# Patient Record
Sex: Female | Born: 1950 | Race: White | Hispanic: No | Marital: Married | Smoking: Never smoker
Health system: Southern US, Community
[De-identification: ages and names within clinical notes are randomized; demographics above are authoritative.]

## PROBLEM LIST (undated history)

## (undated) ENCOUNTER — Emergency Department (HOSPITAL_COMMUNITY): Admission: EM | Disposition: A | Discharge status: Other Acute Inpatient Hospital | Payer: BC Managed Care – PPO

## (undated) DIAGNOSIS — C801 Malignant (primary) neoplasm, unspecified: Secondary | ICD-10-CM

## (undated) DIAGNOSIS — F419 Anxiety disorder, unspecified: Secondary | ICD-10-CM

## (undated) DIAGNOSIS — Z789 Other specified health status: Secondary | ICD-10-CM

## (undated) DIAGNOSIS — Z86718 Personal history of other venous thrombosis and embolism: Secondary | ICD-10-CM

## (undated) HISTORY — PX: TONSILLECTOMY: SUR1361

## (undated) HISTORY — PX: BRAIN SURGERY: SHX531

## (undated) HISTORY — PX: TUBAL LIGATION: SHX77

---

## 2003-09-12 DIAGNOSIS — Z86718 Personal history of other venous thrombosis and embolism: Secondary | ICD-10-CM

## 2003-09-12 HISTORY — DX: Personal history of other venous thrombosis and embolism: Z86.718

## 2012-04-18 ENCOUNTER — Encounter (INDEPENDENT_AMBULATORY_CARE_PROVIDER_SITE_OTHER): Payer: Self-pay | Admitting: *Deleted

## 2012-04-19 ENCOUNTER — Encounter (INDEPENDENT_AMBULATORY_CARE_PROVIDER_SITE_OTHER): Payer: Self-pay

## 2014-02-09 HISTORY — PX: PORTACATH PLACEMENT: SHX2246

## 2014-02-25 DIAGNOSIS — C259 Malignant neoplasm of pancreas, unspecified: Secondary | ICD-10-CM | POA: Insufficient documentation

## 2014-03-06 ENCOUNTER — Inpatient Hospital Stay (HOSPITAL_COMMUNITY)
Admission: EM | Admit: 2014-03-06 | Discharge: 2014-03-06 | DRG: 281 | Disposition: A | Discharge status: Home / Self Care | Payer: BC Managed Care – PPO | Source: Other Acute Inpatient Hospital | Attending: Cardiology | Admitting: Cardiology

## 2014-03-06 ENCOUNTER — Encounter (HOSPITAL_COMMUNITY)
Admission: EM | Disposition: A | Discharge status: Home / Self Care | Payer: BC Managed Care – PPO | Source: Other Acute Inpatient Hospital | Attending: Cardiology

## 2014-03-06 ENCOUNTER — Encounter (HOSPITAL_COMMUNITY): Payer: Self-pay

## 2014-03-06 DIAGNOSIS — E7800 Pure hypercholesterolemia, unspecified: Secondary | ICD-10-CM | POA: Diagnosis present

## 2014-03-06 DIAGNOSIS — I2129 ST elevation (STEMI) myocardial infarction involving other sites: Principal | ICD-10-CM | POA: Diagnosis present

## 2014-03-06 DIAGNOSIS — C259 Malignant neoplasm of pancreas, unspecified: Secondary | ICD-10-CM | POA: Diagnosis present

## 2014-03-06 DIAGNOSIS — Z8673 Personal history of transient ischemic attack (TIA), and cerebral infarction without residual deficits: Secondary | ICD-10-CM

## 2014-03-06 DIAGNOSIS — I2510 Atherosclerotic heart disease of native coronary artery without angina pectoris: Secondary | ICD-10-CM | POA: Diagnosis present

## 2014-03-06 DIAGNOSIS — M47814 Spondylosis without myelopathy or radiculopathy, thoracic region: Secondary | ICD-10-CM | POA: Diagnosis present

## 2014-03-06 HISTORY — PX: CARDIAC CATHETERIZATION: SHX172

## 2014-03-06 HISTORY — DX: Other specified health status: Z78.9

## 2014-03-06 HISTORY — PX: LEFT HEART CATH: SHX5478

## 2014-03-06 LAB — COMPREHENSIVE METABOLIC PANEL
ALBUMIN: 3 g/dL — AB (ref 3.5–5.2)
ALK PHOS: 217 U/L — AB (ref 39–117)
ALT: 206 U/L — ABNORMAL HIGH (ref 0–35)
AST: 145 U/L — AB (ref 0–37)
BUN: 15 mg/dL (ref 6–23)
CO2: 25 mEq/L (ref 19–32)
Calcium: 9.2 mg/dL (ref 8.4–10.5)
Chloride: 97 mEq/L (ref 96–112)
Creatinine, Ser: 0.56 mg/dL (ref 0.50–1.10)
GFR calc Af Amer: >90 mL/min (ref >90)
GFR calc non Af Amer: >90 mL/min (ref >90)
Glucose, Bld: 106 mg/dL — ABNORMAL HIGH (ref 70–99)
POTASSIUM: 3.9 meq/L (ref 3.7–5.3)
Sodium: 137 mEq/L (ref 137–147)
TOTAL PROTEIN: 6.1 g/dL (ref 6.0–8.3)
Total Bilirubin: 0.7 mg/dL (ref 0.3–1.2)

## 2014-03-06 LAB — CBC
HCT: 37.5 % (ref 36.0–46.0)
Hemoglobin: 12.4 g/dL (ref 12.0–15.0)
MCH: 28.8 pg (ref 26.0–34.0)
MCHC: 33.1 g/dL (ref 30.0–36.0)
MCV: 87.2 fL (ref 78.0–100.0)
PLATELETS: 194 10*3/uL (ref 150–400)
RBC: 4.3 MIL/uL (ref 3.87–5.11)
RDW: 13.7 % (ref 11.5–15.5)
WBC: 4.2 10*3/uL (ref 4.0–10.5)

## 2014-03-06 LAB — TROPONIN I
Troponin I: <0.3 ng/mL (ref <0.30)
Troponin I: <0.3 ng/mL (ref <0.30)

## 2014-03-06 LAB — MAGNESIUM: MAGNESIUM: 2.3 mg/dL (ref 1.5–2.5)

## 2014-03-06 LAB — LIPID PANEL
CHOL/HDL RATIO: 4.2 ratio
CHOLESTEROL: 213 mg/dL — AB (ref 0–200)
HDL: 51 mg/dL (ref >39)
LDL Cholesterol: 146 mg/dL — ABNORMAL HIGH (ref 0–99)
Triglycerides: 81 mg/dL (ref <150)
VLDL: 16 mg/dL (ref 0–40)

## 2014-03-06 LAB — TSH: TSH: 3.93 u[IU]/mL (ref 0.350–4.500)

## 2014-03-06 LAB — PROTIME-INR
INR: 1 (ref 0.00–1.49)
PROTHROMBIN TIME: 13.2 s (ref 11.6–15.2)

## 2014-03-06 LAB — POCT ACTIVATED CLOTTING TIME: Activated Clotting Time: 140 seconds

## 2014-03-06 LAB — MRSA PCR SCREENING: MRSA BY PCR: NEGATIVE

## 2014-03-06 SURGERY — LEFT HEART CATH
Anesthesia: LOCAL

## 2014-03-06 MED ORDER — ISOSORBIDE MONONITRATE ER 30 MG PO TB24: 30.0000 mg | ORAL_TABLET | Freq: Every day | ORAL | Status: DC

## 2014-03-06 MED ORDER — METOPROLOL TARTRATE 12.5 MG HALF TABLET
12.5000 mg | ORAL_TABLET | Freq: Two times a day (BID) | ORAL | Status: DC
  Administered 2014-03-06: 12.5 mg via ORAL
  Filled 2014-03-06 (×2): qty 1

## 2014-03-06 MED ORDER — FENTANYL CITRATE 0.05 MG/ML IJ SOLN
INTRAMUSCULAR | Status: AC
  Filled 2014-03-06: qty 2

## 2014-03-06 MED ORDER — SODIUM CHLORIDE 0.9 % IJ SOLN: 3.0000 mL | INTRAMUSCULAR | Status: DC | PRN

## 2014-03-06 MED ORDER — ATORVASTATIN CALCIUM 80 MG PO TABS
80.0000 mg | ORAL_TABLET | Freq: Every day | ORAL | Status: DC
  Filled 2014-03-06: qty 1

## 2014-03-06 MED ORDER — SODIUM CHLORIDE 0.9 % IV SOLN: 250.0000 mL | INTRAVENOUS | Status: DC | PRN

## 2014-03-06 MED ORDER — ASPIRIN EC 81 MG PO TBEC
81.0000 mg | DELAYED_RELEASE_TABLET | Freq: Every day | ORAL | Status: DC
Start: 2014-03-07 — End: 2014-03-06

## 2014-03-06 MED ORDER — ASPIRIN 81 MG PO CHEW
324.0000 mg | CHEWABLE_TABLET | ORAL | Status: AC
  Administered 2014-03-06: 324 mg via ORAL
  Filled 2014-03-06: qty 4

## 2014-03-06 MED ORDER — ROSUVASTATIN CALCIUM 5 MG PO TABS: 5.0000 mg | ORAL_TABLET | Freq: Every day | ORAL | Status: DC

## 2014-03-06 MED ORDER — SODIUM CHLORIDE 0.9 % IJ SOLN
3.0000 mL | Freq: Two times a day (BID) | INTRAMUSCULAR | Status: DC
  Administered 2014-03-06: 10 mL via INTRAVENOUS

## 2014-03-06 MED ORDER — BIVALIRUDIN 250 MG IV SOLR
INTRAVENOUS | Status: AC
  Filled 2014-03-06: qty 250

## 2014-03-06 MED ORDER — MIDAZOLAM HCL 2 MG/2ML IJ SOLN
INTRAMUSCULAR | Status: AC
  Filled 2014-03-06: qty 2

## 2014-03-06 MED ORDER — SODIUM CHLORIDE 0.9 % IJ SOLN
3.0000 mL | Freq: Two times a day (BID) | INTRAMUSCULAR | Status: DC
Start: 2014-03-06 — End: 2014-03-06
  Administered 2014-03-06: 09:00:00 via INTRAVENOUS

## 2014-03-06 MED ORDER — HEPARIN (PORCINE) IN NACL 2-0.9 UNIT/ML-% IJ SOLN
INTRAMUSCULAR | Status: AC
  Filled 2014-03-06: qty 1500

## 2014-03-06 MED ORDER — NITROGLYCERIN 0.2 MG/ML ON CALL CATH LAB
INTRAVENOUS | Status: AC
  Filled 2014-03-06: qty 1

## 2014-03-06 MED ORDER — ASPIRIN 300 MG RE SUPP
300.0000 mg | RECTAL | Status: AC
  Filled 2014-03-06: qty 1

## 2014-03-06 MED ORDER — SODIUM CHLORIDE 0.9 % IJ SOLN
3.0000 mL | INTRAMUSCULAR | Status: DC | PRN
  Administered 2014-03-06: 09:00:00 via INTRAVENOUS

## 2014-03-06 MED ORDER — NITROGLYCERIN 0.4 MG SL SUBL: 0.4000 mg | SUBLINGUAL_TABLET | SUBLINGUAL | Status: DC | PRN

## 2014-03-06 MED ORDER — LIDOCAINE HCL (PF) 1 % IJ SOLN
INTRAMUSCULAR | Status: AC
  Filled 2014-03-06: qty 30

## 2014-03-06 MED ORDER — NITROGLYCERIN IN D5W 200-5 MCG/ML-% IV SOLN
5.0000 ug/min | INTRAVENOUS | Status: DC
  Administered 2014-03-06: 5 ug/min via INTRAVENOUS
  Filled 2014-03-06: qty 250

## 2014-03-06 MED ORDER — ASPIRIN 81 MG PO TBEC: 81.0000 mg | DELAYED_RELEASE_TABLET | Freq: Every day | ORAL | Status: AC

## 2014-03-06 MED ORDER — ONDANSETRON HCL 4 MG/2ML IJ SOLN: 4.0000 mg | Freq: Four times a day (QID) | INTRAMUSCULAR | Status: DC | PRN

## 2014-03-06 MED ORDER — SODIUM CHLORIDE 0.9 % IV SOLN
250.0000 mL | INTRAVENOUS | Status: AC | PRN
  Administered 2014-03-06: 09:00:00 via INTRAVENOUS

## 2014-03-06 MED ORDER — ACETAMINOPHEN 325 MG PO TABS: 650.0000 mg | ORAL_TABLET | ORAL | Status: DC | PRN

## 2014-03-06 MED ORDER — ACETAMINOPHEN 325 MG PO TABS
650.0000 mg | ORAL_TABLET | ORAL | Status: DC | PRN
  Administered 2014-03-06: 650 mg via ORAL
  Filled 2014-03-06: qty 2

## 2014-03-06 MED ORDER — PANTOPRAZOLE SODIUM 40 MG PO TBEC
40.0000 mg | DELAYED_RELEASE_TABLET | Freq: Every day | ORAL | Status: DC
  Administered 2014-03-06: 40 mg via ORAL
  Filled 2014-03-06: qty 1

## 2014-03-06 NOTE — CV Procedure (Signed)
Cardiac cath report dictated on 03/06/2014 dictation number is [id number]

## 2014-03-06 NOTE — Progress Notes (Signed)
Subjective:  Doing well denies any chest pain or shortness of breath. Up in chair walking in room. Groin is stable 2 sets of cardiac enzymes are negative EKG has normalized  Objective:  Vital Signs in the last 24 hours: Temp:  [97.8 F (36.6 C)-98.1 F (36.7 C)] 98.1 F (36.7 C) (06/26 0700) Pulse Rate:  [70-92] 87 (06/26 1032) Resp:  [9-23] 18 (06/26 1000) BP: (94-117)/(54-71) 117/71 mmHg (06/26 1032) SpO2:  [94 %-98 %] 97 % (06/26 0900) Weight:  [65.7 kg (144 lb 13.5 oz)] 65.7 kg (144 lb 13.5 oz) (06/26 0900)  Intake/Output from previous day: 06/25 0701 - 06/26 0700 In: 756 [I.V.:756] Out: 550 [Urine:550] Intake/Output from this shift: Total I/O In: 1011 [P.O.:405; I.V.:606] Out: -   Physical Exam: Neck: no adenopathy, no carotid bruit, no JVD and supple, symmetrical, trachea midline Lungs: clear to auscultation bilaterally Heart: regular rate and rhythm, S1, S2 normal, no murmur, click, rub or gallop Abdomen: soft, non-tender; bowel sounds normal; no masses,  no organomegaly Extremities: extremities normal, atraumatic, no cyanosis or edema and Right groin no evidence of hematoma or bruit  Lab Results:  Recent Labs  03/06/14 0330  WBC 4.2  HGB 12.4  PLT 194    Recent Labs  03/06/14 0330  NA 137  K 3.9  CL 97  CO2 25  GLUCOSE 106*  BUN 15  CREATININE 0.56    Recent Labs  03/06/14 0330 03/06/14 0920  TROPONINI <0.30 <0.30   Hepatic Function Panel  Recent Labs  03/06/14 0330  PROT 6.1  ALBUMIN 3.0*  AST 145*  ALT 206*  ALKPHOS 217*  BILITOT 0.7    Recent Labs  03/06/14 0330  CHOL 213*   No results found for this basename: PROTIME,  in the last 72 hours  Imaging: Imaging results have been reviewed and No results found.  Cardiac Studies:  Assessment/Plan:   variant angina status post left cath Metastatic CA of pancreas History of questionable subarachnoid hemorrhage in the past Thoracic spondylosis Hypercholesteremia Plan DC  home Post cardiac cath instructions have been given Patient will followup with heme/ onco.  Next week Dictated discharge summary   LOS: 0 days    Aylah Yeary N 03/06/2014, 11:46 AM

## 2014-03-06 NOTE — Discharge Summary (Signed)
  Discharge summary dictated on 03/06/2014 dictation number is [id number]

## 2014-03-06 NOTE — Discharge Instructions (Signed)
Coronary Angiogram A coronary angiogram, also called coronary angiography, is an X-ray procedure used to look at the arteries in the heart. In this procedure, a dye (contrast dye) is injected through a long, hollow tube (catheter). The catheter is about the size of a piece of cooked spaghetti and is inserted through your groin, wrist, or arm. The dye is injected into each artery, and X-rays are then taken to show if there is a blockage in the arteries of your heart. LET [facility] CARE PROVIDER KNOW ABOUT:  Any allergies you have, including allergies to shellfish or contrast dye.   All medicines you are taking, including vitamins, herbs, eye drops, creams, and over-the-counter medicines.   Previous problems you or members of your family have had with the use of anesthetics.   Any blood disorders you have.   Previous surgeries you have had.  History of kidney problems or failure.   Other medical conditions you have. RISKS AND COMPLICATIONS  Generally, a coronary angiogram is a safe procedure. However, as with any procedure, problems can occur. Possible problems include:  Allergic reaction to the dye.  Bleeding from the access site or other locations.  Kidney injury, especially in people with impaired kidney function.  Stroke (rare).  Heart attack (rare). BEFORE THE PROCEDURE   Do not eat or drink anything after midnight the night before the procedure, or as directed by your health care provider.   Ask your health care provider about changing or stopping your regular medicines. This is especially important if you are taking diabetes medicines or blood thinners. PROCEDURE  You may be given a medicine to help you relax (sedative) before the procedure. This medicine is given through an intravenous (IV) access tube that is inserted into one of your veins.   The area where the catheter will be inserted is washed and shaved. This is usually done in the groin but may be done in  the fold of your arm (near your elbow) or in the wrist.   A medicine will be given to numb the area where the catheter will be inserted (local anesthetic).   The health care provider will insert the catheter into an artery. The catheter is guided by using a special type of X-ray (fluoroscopy) of the blood vessel being examined.   A special dye is then injected into the catheter, and X-rays are taken. The dye helps to show where any narrowing or blockages are located in the heart arteries.  AFTER THE PROCEDURE   If the procedure is done through the leg, you will be kept in bed lying flat for several hours. You will be instructed to not bend or cross your legs.  The insertion site will be checked frequently.   The pulse in your feet or wrist will be checked frequently.   Additional blood tests, X-rays, and an electrocardiogram may be done.   You may need to stay in the hospital overnight for observation.  Document Released: 03/04/2003 Document Revised: 09/02/2013 Document Reviewed: 01/20/2013 [facility] Patient Information 2015 [place], [place]. This information is not intended to replace advice given to you by your health care provider. Make sure you discuss any questions you have with your health care provider. Angina Pectoris Angina pectoris, often just called angina, is extreme discomfort in your chest, neck, or arm caused by a lack of blood in the middle and thickest layer of your heart wall (myocardium). It may feel like tightness or heavy pressure. It may feel like a crushing  or squeezing pain. Some people say it feels like gas or indigestion. It may go down your shoulders, back, and arms. Some people may have symptoms other than pain. These symptoms include fatigue, shortness of breath, cold sweats, or nausea. There are four different types of angina:  Stable angina--Stable angina usually occurs in episodes of predictable frequency and duration. It usually is brought on by physical  activity, emotional stress, or excitement. These are all times when the myocardium needs more oxygen. Stable angina usually lasts a few minutes and often is relieved by taking a medicine that can be taken under your tongue (sublingually). The medicine is called nitroglycerin. Stable angina is caused by a buildup of plaque inside the arteries, which restricts blood flow to the heart muscle (atherosclerosis).  Unstable angina--Unstable angina can occur even when your body experiences little or no physical exertion. It can occur during sleep. It can also occur at rest. It can suddenly increase in severity or frequency. It might not be relieved by sublingual nitroglycerin. It can last up to 30 minutes. The most common cause of unstable angina is a blood clot that has developed on the top of plaque buildup inside a coronary artery. It can lead to a heart attack if the blood clot completely blocks the artery.  Microvascular angina--This type of angina is caused by a disorder of tiny blood vessels called arterioles. Microvascular angina is more common in women. The pain may be more severe and last longer than other types of angina pectoris.  Prinzmetal or variant angina--This type of angina pectoris usually occurs when your body experiences little or no physical exertion. It especially occurs in the early morning hours. It is caused by a spasm of your coronary artery. HOME CARE INSTRUCTIONS   Only take over-the-counter and prescription medicines as directed by your health care provider.  Stay active or increase your exercise as directed by your health care provider.  Limit strenuous activity as directed by your health care provider.  Limit heavy lifting as directed by your health care provider.  Maintain a healthy weight.  Learn about and eat heart-healthy foods.  Do not use any tobacco products including cigarettes, chewing tobacco or electronic cigarettes. SEEK IMMEDIATE MEDICAL CARE IF:  You  experience the following symptoms:  Chest, neck, deep shoulder, or arm pain or discomfort that lasts more than a few minutes.  Chest, neck, deep shoulder, or arm pain or discomfort that goes away and comes back, repeatedly.  Heavy sweating with discomfort, without a noticeable cause.  Shortness of breath or difficulty breathing.  Angina that does not get better after a few minutes of rest or after taking sublingual nitroglycerin. These can all be symptoms of a heart attack, which is a medical emergency! Get medical help at once. Call your local emergency service (911 in [place] immediately. Do not  drive yourself to the hospital and do not  wait to for your symptoms to go away. MAKE SURE YOU:  Understand these instructions.  Will watch your condition.  Will get help right away if you are not doing well or get worse. Document Released: 08/28/2005 Document Revised: 09/02/2013 Document Reviewed: 06/06/2012 [facility] Patient Information 2015 [place], [place]. This information is not intended to replace advice given to you by your health care provider. Make sure you discuss any questions you have with your health care provider.

## 2014-03-06 NOTE — Progress Notes (Addendum)
Nutrition Brief Note  Patient identified on the Malnutrition Screening Tool (MST) Report. Pt reports that her usual body weight is around 150 lb. She reports that she has recently read that she should not eat any carbohydrates or sugars for her pancreatic cancer. I discussed that this isn't appropriate for her and she may have a difficult time meeting her calorie needs by eliminating these foods from her diet. Pt verbalized understanding, but will likely continue to eliminate all CHO from her diet. She declines any oral nutrition supplements at this time. Appears well-nourished without any significant fat/muscle wasting. Encouraged her to allow her family to bring in foods to help her meet her nutritional needs.  Also provided her with RD contact information for [facility] to help provide additional evidence-based nutritional recommendations. Patient appreciative of information.  Wt Readings from Last 15 Encounters:  03/06/14 144 lb 13.5 oz (65.7 kg)  03/06/14 144 lb 13.5 oz (65.7 kg)    Body mass index is 25.66 kg/(m^2). Patient meets criteria for Overweight based on current BMI.   Current diet order is Heart Healthy, patient is consuming approximately 25% of meals at this time. Labs and medications reviewed.   No nutrition interventions warranted at this time. If nutrition issues arise, please consult RD.   Inda Coke MS, RD, LDN Inpatient Registered Dietitian Pager: [phone] After-hours pager: [phone]

## 2014-03-06 NOTE — Discharge Summary (Signed)
NAME:  Barbara Fuller, Barbara Fuller                 ACCOUNT NO.:  [id number]  MEDICAL RECORD NO.:  [id number]  LOCATION:  2H10C                        FACILITY:  [facility]  PHYSICIAN:  Mohan N. Harwani, M.D. DATE OF BIRTH:  03/08/1951  DATE OF ADMISSION:  03/06/2014 DATE OF DISCHARGE:  03/06/2014                              DISCHARGE SUMMARY   ADMITTING DIAGNOSES: 1. Acute lateral wall injury, rule out myocardial infarction. 2. Probable metastatic cancer of pancreas. 3. History of subarachnoid hemorrhage in the past. 4. Thoracic spondylosis.  DISCHARGE DIAGNOSES: 1. Status post variant angina status post left cardiac cath. 2. Metastatic cancer of pancreas status post recent chemotherapy. 3. History of subarachnoid hemorrhage in the past. 4. Hypercholesteremia. 5. Thoracic spondylosis.  DISCHARGE HOME MEDICATIONS: 1. Aspirin 81 mg 1 tablet daily. 2. Imdur 30 mg 1 tablet daily. 3. Nitrostat 0.4 mg sublingual use as directed. 4. Crestor 5 mg daily. 5. Xanax 0.25 mg daily at night as needed for anxiety. 6. Multivitamins with mineral 1 tablet daily. 7. Clear Eyes eye drops as before. 8. Lovaza 1 g daily. 9. Vitamin C 500 mg daily.  DIET:  Low salt, low cholesterol.  ACTIVITY:  Increase activity slowly as tolerated.  DISCHARGE INSTRUCTIONS:  Post cardiac cath instructions have been given.  FOLLOW UP:  With me in 1 week.  Follow up with Heme-Onc at [facility] as scheduled in 1 week.  CONDITION AT DISCHARGE:  Stable.  BRIEF HISTORY AND HOSPITAL COURSE:  Ms. Barbara Fuller is a 63-year-old female with past medical history significant for recently diagnosed metastatic pancreatic CA, history of headaches in the past, history of questionable subarachnoid hemorrhage in 2007, was transferred from [facility] ER as code STEMI was called.  The patient complained of recurrent retrosternal chest pain described as pressure, tightness radiating to the neck and jaw associated with nausea after  chemotherapy. Initial EKG done in the ER showed normal sinus rhythm with nonspecific T- wave changes.  The patient was noted to have minimally elevated troponin I and D-dimer, subsequently underwent CT of the chest which showed no evidence of pulmonary embolism.  The patient again developed retrosternal chest pressure, heaviness, tightness.  A repeat EKG showed normal sinus rhythm with ST elevation in lead 1, aVL, and V5 and V6 with T-wave inversion in lead V1, V2, and V3, suggestive of acute lateral wall injury.  The patient received IV heparin, nitro, 600 mg of Plavix, and was transferred to [facility] for emergency PCI.  The patient denies such episodes of chest pain in the past.  Denies palpitation, lightheadedness, or syncope.  Denies PND, orthopnea, or leg swelling.  Denies any chest pain when seen in the cath lab early this morning.  PAST MEDICAL HISTORY:  As above.  PHYSICAL EXAMINATION:  GENERAL:  She was alert, awake, oriented x3. Hemodynamically stable. HEENT:  Conjunctivae was pink. NECK:  Supple.  No JVD.  No bruit. LUNGS:  Clear to auscultation without rhonchi, rales. CARDIOVASCULAR:  S1, S2 was normal.  There was soft systolic murmur and S4 gallop. ABDOMEN:  Soft.  Bowel sounds were present.  Nontender. EXTREMITIES:  There was no clubbing, cyanosis, or edema.  LABORATORY DATA:    Early this morning; sodium is 137, potassium 3.9, BUN 15, creatinine 0.56, glucose is 106. Her liver enzymes are elevated, alk phos is 217, albumin is low 3.0, AST 145, ALT was 206.  First set of troponin I at [facility] was 0.07, repeat 2 sets of troponin I are normal less than 0.30.  Two sets of troponin-I were negative. Cholesterol was 213, LDL was elevated at 146, HDL 51, triglycerides 81. Hemoglobin was 12.4, hematocrit 37.5, white count of 4.2, platelet count 194,000.  TSH was normal at 3.930.  MRSA by PCR was negative.  Repeat EKG earlier this morning showed normal sinus  rhythm with early repolarization changes.  There was marked improvement in ST elevation in lateral leads.  BRIEF HOSPITAL COURSE:  The patient was directly transferred from [facility] ER to [facility] Cath Lab and underwent left cardiac cath with selective left and right coronary angiography as per procedure report.  The patient tolerated the procedure well.  There were no complications.  Postprocedure, the patient did not had any episodes of chest pain during the hospital stay.  Her groin is stable with no evidence of hematoma or bruit.  The patient is ambulating in the hallway without any problems.  The patient's cardiac enzymes have been negative. Her groin is stable.  The patient will be discharged home on above medications and will be followed up in my office in 1 week.  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  We will monitor liver function closely while on statin.     Allegra Lai. Terrence Dupont, M.D.     MNH/MEDQ  D:  03/06/2014  T:  03/06/2014  Job:  [id number]

## 2014-03-06 NOTE — Cardiovascular Report (Signed)
Barbara Fuller, Barbara Fuller                 ACCOUNT NO.:  [id number]  MEDICAL RECORD NO.:  [id number]  LOCATION:  2H10C                        FACILITY:  [facility]  PHYSICIAN:  Prophet Renwick N. Terrence Dupont, M.D. DATE OF BIRTH:  Jun 04, 1951  DATE OF PROCEDURE:  03/06/2014 DATE OF DISCHARGE:                           CARDIAC CATHETERIZATION   PROCEDURES PERFORMED:  Left cardiac catheterization with selective left and right coronary angiography, left ventriculography via right groin using Judkins technique.  INDICATION FOR THE PROCEDURE:  Barbara Fuller is a 63 year old female with past medical history significant for recently diagnosed metastatic pancreatic carcinoma, history of headaches in the past, history of questionable subarachnoid hemorrhage in the past in 2007.  She was transferred from [facility] ER as code STEMI was called. The patient complained of recurrent retrosternal chest pain described as pressure, tightness, radiating to the neck and jaw, associated with nausea after starting chemotherapy today.  Initial EKG done in the ER showed normal sinus rhythm with nonspecific T-wave changes.  The patient was noted to have minimally elevated troponin I and D-dimers, subsequently underwent CT of the chest which showed no evidence of pulmonary embolism.  The patient again developed retrosternal chest pain, pressure, heaviness, tightness. Repeat EKG showed normal sinus rhythm with ST elevation in lead 1 aVL. and V5 and V6 with T-wave inversion in lead V1, V2, and V3 suggestive of acute lateral wall injury pattern.  The patient received nitro, IV heparin, and 600 mg of Plavix and was transferred to [facility] for emergency PCI.  The patient denies such episodes of chest pain in the past.  Denies palpitation, lightheadedness, or syncope.  Denies PND, orthopnea, or leg swelling.  The patient when seen in the cath lab denies any chest pain at present.  Due to typical anginal chest pain and  EKG changes, discussed briefly with the patient regarding emergency left cath, possible PTCA stenting, its risks and benefits, i.e., death, MI, stroke, need for emergency CABG, local vascular complications, etc. and consented for PCI.  DESCRIPTION OF PROCEDURE:  After obtaining the informed consent, the patient was directly brought to the cath lab and was placed on fluoroscopy table.  Right groin was prepped and draped in usual fashion. 1% Xylocaine was used for local anesthesia in the right groin.  With the help of thin wall needle, a 6-French arterial sheath was placed.  The sheath was aspirated and flushed.  Next, 6-French left Judkins catheter was advanced over the wire under fluoroscopic guidance up to the ascending aorta.  Wire was pulled out.  The catheter was aspirated and connected to the Manifold.  Catheter was further advanced and engaged into left coronary ostium.  Multiple views of the left system were taken.  Next, catheter was disengaged and was pulled out over the wire and was replaced with 6-French right Judkins catheter which was advanced over the wire under fluoroscopic guidance up to the ascending aorta. Wire was pulled out.  The catheter was aspirated and connected to the Manifold.  Catheter was further advanced and engaged into right coronary ostium.  Multiple views of the right system were taken.  Next, catheter was disengaged and was pulled out over the  wire and was replaced with 6- French pigtail catheter which was advanced over the wire under fluoroscopic guidance up to the ascending aorta.  Wire was pulled out. The catheter was aspirated and connected to the Manifold.  Catheter was further advanced across the aortic valve into the LV.  LV pressures were recorded.  Next, LV graft was done in 30-degree RAO position.  Post- angiographic pressures were recorded from LV and then pullback pressures were recorded from the aorta.  There was no gradient across the  aortic valve.  Next, the pigtail catheter was pulled out over the wire. Sheaths were aspirated and flushed.  FINDINGS:  LV showed good LV systolic function,  EF of 50 55%.  Left main was patent.  LAD has 20%-25%  smooth ostial stenosis and 10-15% proximal and mid stenosis.  Diagonal 1 has ostial 30-40% stenosis with minimal haziness, but TIMI grade 3 distal flow.  Diagonal 2 was very, very small.  Left circumflex was patent and tapers down in AV groove after giving off OM3.  OM1 was very, very small.  OM2 was large which was patent.  OM3 was very small which was patent.  RCA was large which was patent.  PDA and PLV branches were patent.  The patient tolerated procedure well.  The patient did not have any episodes of chest pain. During the procedure, her ST appears to have come down on the monitor. We will repeat the EKG after the cath and monitor her serial enzymes. The patient was transferred to CCU in stable condition.     Barbara Fuller. Terrence Dupont, M.D.     MNH/MEDQ  D:  03/06/2014  T:  03/06/2014  Job:  [id number]

## 2014-03-06 NOTE — H&P (Signed)
Barbara Fuller is an 63 y.o. female.   Chief Complaint: Recurrent chest pain with ST elevation in lateral leads HPI: Patient is 63 year old female with past medical history significant for recently diagnosed pancreatic CA, history of and headaches in the past history of questionable subarachnoid hemorrhage in 2007, was transferred from [facility] ER code STEMI was called. Patient complains of recurrent retrosternal chest pain described as pressure tightness radiating to the neck and jaw associated with nausea after starting chemotherapy. Initial EKG done in the ER showed normal sinus rhythm with nonspecific T wave changes patient was noted to have minimally elevated troponin I. and d-dimer subsequently underwent CT of the chest which showed no evidence of pulmonary embolism. Patient again developed retrosternal chest pressure heaviness tightness repeat EKG showed normal sinus rhythm with ST elevation in lead 1 and aVL and V5 and V6 with T wave inversion in lead V1 V2 and lead 3 her suggest of acute lateral wall injury her patient received the IV heparin nitroglycerin and 600 mg of Plavix and was transferred to [facility] for emergency PCI. Patient denies such episodes of chest pain in the past denies any palpitation lightheadedness or syncope denies pain the orthopnea leg swelling. Patient when seen in Cath Lab denies any chest pain at present.  No past medical history on file.  No past surgical history on file.  No family history on file. Social History:  has no tobacco, alcohol, and drug history on file.  Allergies: Allergies not on file  No prescriptions prior to admission    No results found for this or any previous visit (from the past 48 hour(s)). No results found.  Review of Systems  Constitutional: Negative for fever and chills.  HENT: Negative for hearing loss.   Eyes: Negative for double vision, photophobia and pain.  Cardiovascular: Positive for chest pain.  Negative for palpitations, orthopnea, claudication and leg swelling.  Gastrointestinal: Positive for nausea. Negative for abdominal pain and diarrhea.  Genitourinary: Negative for dysuria.  Neurological: Negative for dizziness, tingling and headaches.    There were no vitals taken for this visit. Physical Exam  Constitutional: She is oriented to person, place, and time.  HENT:  Head: Normocephalic and atraumatic.  Eyes: Conjunctivae are normal. Left eye exhibits no discharge. No scleral icterus.  Neck: Normal range of motion. Neck supple. No tracheal deviation present. No thyromegaly present.  Cardiovascular: Normal rate and regular rhythm.   Murmur (Soft systolic murmur and S4 gallop noted) heard. Respiratory: Effort normal and breath sounds normal.  GI: Soft. Bowel sounds are normal. She exhibits no distension. There is no tenderness.  Musculoskeletal: She exhibits no edema and no tenderness.  Neurological: She is alert and oriented to person, place, and time.     Assessment/Plan Acute lateral wall injury Probable metastatic CA of pancreas History of subarachnoid hemorrhage in the past Thoracic spondylosis Plan Discussed briefly with patient regarding emergency left cath possible PTCA stenting its risk and benefits and consents for PCI  [facility] N 03/06/2014, 1:53 AM

## 2014-03-06 NOTE — Progress Notes (Signed)
Patient discharged to home with husband via wheelchair.  Discharge instructions and prescriptions given to patient with all questions addressed and answered. PIVs x3 discontinued with pressure dressings at sites.  Telemetry monitor disconnected.  Ambulated in room prior to discharge without chestpain or discomfort. Patient awake,alert and oriented at time of discharge.

## 2014-03-09 MED FILL — Sodium Chloride IV Soln 0.9%: INTRAVENOUS | Qty: 50 | Status: AC

## 2014-05-27 ENCOUNTER — Other Ambulatory Visit: Payer: Self-pay | Admitting: Internal Medicine

## 2014-05-27 DIAGNOSIS — C787 Secondary malignant neoplasm of liver and intrahepatic bile duct: Principal | ICD-10-CM

## 2014-05-27 DIAGNOSIS — C259 Malignant neoplasm of pancreas, unspecified: Secondary | ICD-10-CM

## 2014-06-16 ENCOUNTER — Ambulatory Visit
Admission: RE | Admit: 2014-06-16 | Discharge: 2014-06-16 | Disposition: A | Discharge status: Home / Self Care | Payer: BC Managed Care – PPO | Source: Ambulatory Visit | Attending: Internal Medicine | Admitting: Internal Medicine

## 2014-06-16 VITALS — BP 126/63 | HR 95 | Temp 98.3°F | Resp 14 | Ht 63.5 in | Wt 141.0 lb

## 2014-06-16 DIAGNOSIS — C259 Malignant neoplasm of pancreas, unspecified: Secondary | ICD-10-CM

## 2014-06-16 DIAGNOSIS — C787 Secondary malignant neoplasm of liver and intrahepatic bile duct: Principal | ICD-10-CM

## 2014-06-16 HISTORY — DX: Anxiety disorder, unspecified: F41.9

## 2014-06-16 HISTORY — DX: Malignant (primary) neoplasm, unspecified: C80.1

## 2014-06-16 NOTE — Consult Note (Signed)
Chief Complaint: Chief Complaint  Patient presents with  . Advice Only    Consult for Y-90 SIRT    Referring Physician(s): Darovsky,Boris M  History of Present Illness: Barbara Fuller is a 63 y.o. 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  Original diagnosis was in June with liver biopsy on 6/16 demonstrating metastatic adenocarcinoma to liver.  She is status post 6 cycles of FOLFIRINOX chemotherapy which has been complicated by significant chest pain with 5-FU infusion.  Full cardiac workup by Dr. Hamilton Capri was negative and the patient does not have evidence of coronary artery disease.   5-FU dosing was reduced and more recently, the patient's CA 19-9 has risen with CT showing slight progression of hepatic metastatic disease, but reduction in size of the pancreatic tail carcinoma and diminished splenic metastases.   Past Medical History  Diagnosis Date  . Non-smoker   . Cancer     Metastatic pancreatic adenocarcinoma w/ liver metastses    . Anxiety     Past Surgical History  Procedure Laterality Date  . Brain surgery    . Tubal ligation    . Tonsillectomy    . Portacath placement  02/2014  . Cardiac catheterization  03/06/2014    Allergies: Amoxicillin; Keflex; Other; and Penicillins  Medications: Prior to Admission medications   Medication Sig Start Date End Date Taking? Authorizing Provider  calcium elemental as carbonate (PX ANTACID MAXIMUM STRENGTH) 400 MG tablet Chew by mouth.   Yes Historical Provider, MD  isosorbide mononitrate (IMDUR) 30 MG 24 hr tablet Take 1 tablet (30 mg total) by mouth daily. 03/06/14  Yes Clent Demark, MD  lidocaine-prilocaine (EMLA) cream  03/03/14  Yes Historical Provider, MD  LORazepam (ATIVAN) 1 MG tablet  03/03/14  Yes Historical Provider, MD  naphazoline-glycerin (CLEAR EYES) 0.012-0.2 % SOLN Place 1-2 drops into both eyes every 4 (four)  hours as needed for irritation.   Yes Historical Provider, MD  nitroGLYCERIN (NITROSTAT) 0.4 MG SL tablet Place 1 tablet (0.4 mg total) under the tongue every 5 (five) minutes x 3 doses as needed for chest pain. 03/06/14  Yes Clent Demark, MD  polyethylene glycol (MIRALAX / GLYCOLAX) packet Take 17 g by mouth daily.   Yes Historical Provider, MD  ALPRAZolam Duanne Moron) 0.5 MG tablet Take 0.25 mg by mouth at bedtime as needed for anxiety or sleep.    Historical Provider, MD  aspirin EC 81 MG EC tablet Take 1 tablet (81 mg total) by mouth daily. 03/07/14   Clent Demark, MD  diphenhydrAMINE (BENADRYL) 25 mg capsule Take by mouth.    Historical Provider, MD  Multiple Vitamin (MULTIVITAMIN WITH MINERALS) TABS tablet Take 1 tablet by mouth daily.    Historical Provider, MD  omega-3 acid ethyl esters (LOVAZA) 1 G capsule Take 1 g by mouth daily.    Historical Provider, MD  Omega-3 Fatty Acids (FISH OIL) 1000 MG CAPS Take by mouth.    Historical Provider, MD  riboflavin (VITAMIN B-2) 100 MG TABS tablet Take by mouth.    Historical Provider, MD  rosuvastatin (CRESTOR) 5 MG tablet Take 1 tablet (5 mg total) by mouth daily. 03/06/14   Clent Demark, MD  senna (SENOKOT) 8.6 MG tablet Take by mouth.    Historical Provider, MD  vitamin C (ASCORBIC ACID) 500 MG tablet Take 500 mg by mouth daily.    Historical Provider, MD  Family History:  Mother with history of colon cancer.  History   Social History  . Marital Status: Married    Spouse Name: [name]    Number of Children: None  . Years of Education: N/A   Social History Main Topics  . Smoking status: Never Smoker   . Smokeless tobacco: None  . Alcohol Use: Social  . Drug Use: No  . Sexual Activity: None   Other Topics Concern  . Retired from [employer] History Narrative  . None    ECOG Status: 0 - Asymptomatic  Review of Systems: A 12 point ROS discussed and pertinent positives are indicated in the HPI  above.  All other systems are negative.  Review of Systems   Constitutional: Positive for fatigue. Negative for fever and chills.  HENT: Negative.   Eyes: Negative.   Respiratory: Negative.   Cardiovascular: Negative.   Gastrointestinal: Positive for nausea. Negative for vomiting, abdominal pain, diarrhea, constipation, blood in stool and abdominal distention.  Genitourinary: Negative.   Musculoskeletal: Negative.   Psychiatric/Behavioral: The patient is nervous/anxious.     Vital Signs: BP 126/63  Pulse 95  Temp(Src) 98.3 F (36.8 C) (Oral)  Resp 14  Ht 5' 3.5" (1.613 m)  Wt 141 lb (63.957 kg)  BMI 24.58 kg/m2  SpO2 97%  Physical Exam  Constitutional: She is oriented to person, place, and time. She appears well-developed and well-nourished. No distress.  Neck: No JVD present.  Cardiovascular: Normal rate, regular rhythm and normal heart sounds.  Exam reveals no gallop and no friction rub.   No murmur heard. Pulmonary/Chest: Effort normal and breath sounds normal. No stridor. No respiratory distress. She has no wheezes. She has no rales. She exhibits no tenderness.  Abdominal: Soft. Bowel sounds are normal. She exhibits no distension and no mass. There is no tenderness. There is no rebound and no guarding.  Musculoskeletal: She exhibits no edema.  Neurological: She is alert and oriented to person, place, and time.  Skin: Skin is warm and dry. No rash noted. No erythema. No pallor.    Imaging: Echo on 03/17/14 negative with EF of 60-65%  CT imaging reviewed including 8/19 and 6/10.  There are roughly 20-25 hepatic metastatic lesions throughout all segments with the largest measuring 3.9 cm.  These show early peripheral enhancement on arterial phase imaging.  The pancreatic tail mass shows slight reduction in size recently and invades the splenic hilum.  It also abuts the left kidney.  No definite carcinomatosis or evidence of ascites by CT.  Labs:  CBC:  Recent Labs   03/06/14 0330  WBC 4.2  HGB 12.4  HCT 37.5  PLT 194    COAGS:  Recent Labs  03/06/14 0330  INR 1.00    BMP:  Recent Labs  03/06/14 0330  NA 137  K 3.9  CL 97  CO2 25  GLUCOSE 106*  BUN 15  CALCIUM 9.2  CREATININE 0.56  GFRNONAA >90  GFRAA >90    LIVER FUNCTION TESTS:  Recent Labs  03/06/14 0330  BILITOT 0.7  AST 145*  ALT 206*  ALKPHOS 217*  PROT 6.1  ALBUMIN 3.0*    TUMOR MARKERS: CA 19-9:  488.  Assessment and Plan:  I met with Mrs. Scarpino and reviewed her imaging findings and treatment course with her.  I reviewed percutaneous treatment options with her, which are limited to transarterial treatments of the liver given the number and distribution of lesions.  The 2 adjunct options include radioembolization with Y-90 microspheres and chemoembolization with drug-eluting beads.    Details of both procedures were discussed with the patient including risks, benefits, and differences in treatment.  Although not curative, additional hepatic transcatheter treatment could benefit survival by limiting progression of hepatic metastatic disease.  The main obstacle may be insurance approval of these procedures, especially radioembolization, given the more limited data in the setting of metastatic pancreatic carcinoma.  We will submit for approval of both radioembolization and chemoembolization.  I encouraged Mrs. Serano to continue systemic treatment with Dr. Jacquiline Doe given some potential delays in being able to perform a transcatheter hepatic treatment.  The patient desires to go back to higher dosing of 5-FU as it seemed to have been effective in reducing CA 19-9 levels initially.  I encouraged the patient to discuss this with Dr. Jacquiline Doe.  Thank you for this interesting consult.  I greatly enjoyed meeting Kennethia Lynes and look forward to participating in their care.  I spent a total of 40 minutes face to face in clinical consultation, greater than 50% of which was  counseling/coordinating care for treatment of hepatic metastatic disease.  SignedAletta Edouard T 06/16/2014, 4:13 PM

## 2014-06-23 ENCOUNTER — Other Ambulatory Visit: Payer: Self-pay | Admitting: Internal Medicine

## 2014-06-23 ENCOUNTER — Other Ambulatory Visit (HOSPITAL_COMMUNITY): Payer: Self-pay | Admitting: Interventional Radiology

## 2014-06-23 DIAGNOSIS — C787 Secondary malignant neoplasm of liver and intrahepatic bile duct: Principal | ICD-10-CM

## 2014-06-23 DIAGNOSIS — C259 Malignant neoplasm of pancreas, unspecified: Secondary | ICD-10-CM

## 2014-07-06 ENCOUNTER — Ambulatory Visit (HOSPITAL_COMMUNITY): Payer: MEDICAID

## 2014-07-06 ENCOUNTER — Other Ambulatory Visit (HOSPITAL_COMMUNITY): Payer: Self-pay | Admitting: Interventional Radiology

## 2014-07-06 ENCOUNTER — Encounter (HOSPITAL_COMMUNITY): Payer: MEDICAID

## 2014-07-06 DIAGNOSIS — C259 Malignant neoplasm of pancreas, unspecified: Secondary | ICD-10-CM

## 2014-07-06 DIAGNOSIS — C787 Secondary malignant neoplasm of liver and intrahepatic bile duct: Principal | ICD-10-CM

## 2014-07-08 ENCOUNTER — Other Ambulatory Visit: Payer: Self-pay | Admitting: Interventional Radiology

## 2014-07-08 DIAGNOSIS — C787 Secondary malignant neoplasm of liver and intrahepatic bile duct: Principal | ICD-10-CM

## 2014-07-08 DIAGNOSIS — C259 Malignant neoplasm of pancreas, unspecified: Secondary | ICD-10-CM

## 2014-07-16 ENCOUNTER — Other Ambulatory Visit: Payer: Self-pay | Admitting: Radiology

## 2014-07-17 ENCOUNTER — Encounter (HOSPITAL_COMMUNITY): Payer: Self-pay

## 2014-07-17 ENCOUNTER — Encounter (HOSPITAL_COMMUNITY)
Admission: RE | Admit: 2014-07-17 | Discharge: 2014-07-17 | Disposition: A | Discharge status: Home / Self Care | Payer: BC Managed Care – PPO | Source: Ambulatory Visit | Attending: Interventional Radiology | Admitting: Interventional Radiology

## 2014-07-17 ENCOUNTER — Ambulatory Visit (HOSPITAL_COMMUNITY)
Admission: RE | Admit: 2014-07-17 | Discharge: 2014-07-17 | Disposition: A | Discharge status: Home / Self Care | Payer: BC Managed Care – PPO | Source: Ambulatory Visit | Attending: Interventional Radiology | Admitting: Interventional Radiology

## 2014-07-17 ENCOUNTER — Other Ambulatory Visit: Payer: Self-pay | Admitting: Interventional Radiology

## 2014-07-17 DIAGNOSIS — Z9221 Personal history of antineoplastic chemotherapy: Secondary | ICD-10-CM | POA: Diagnosis not present

## 2014-07-17 DIAGNOSIS — C799 Secondary malignant neoplasm of unspecified site: Secondary | ICD-10-CM | POA: Insufficient documentation

## 2014-07-17 DIAGNOSIS — C259 Malignant neoplasm of pancreas, unspecified: Secondary | ICD-10-CM

## 2014-07-17 DIAGNOSIS — C787 Secondary malignant neoplasm of liver and intrahepatic bile duct: Principal | ICD-10-CM

## 2014-07-17 DIAGNOSIS — Z79899 Other long term (current) drug therapy: Secondary | ICD-10-CM | POA: Insufficient documentation

## 2014-07-17 DIAGNOSIS — F419 Anxiety disorder, unspecified: Secondary | ICD-10-CM | POA: Insufficient documentation

## 2014-07-17 HISTORY — DX: Personal history of other venous thrombosis and embolism: Z86.718

## 2014-07-17 LAB — COMPREHENSIVE METABOLIC PANEL
ALT: 151 U/L — ABNORMAL HIGH (ref 0–35)
ANION GAP: 12 (ref 5–15)
AST: 101 U/L — ABNORMAL HIGH (ref 0–37)
Albumin: 3.4 g/dL — ABNORMAL LOW (ref 3.5–5.2)
Alkaline Phosphatase: 440 U/L — ABNORMAL HIGH (ref 39–117)
BUN: 8 mg/dL (ref 6–23)
CALCIUM: 9 mg/dL (ref 8.4–10.5)
CO2: 25 meq/L (ref 19–32)
CREATININE: 0.57 mg/dL (ref 0.50–1.10)
Chloride: 105 mEq/L (ref 96–112)
GLUCOSE: 116 mg/dL — AB (ref 70–99)
Potassium: 4.1 mEq/L (ref 3.7–5.3)
SODIUM: 142 meq/L (ref 137–147)
Total Bilirubin: 0.7 mg/dL (ref 0.3–1.2)
Total Protein: 6.8 g/dL (ref 6.0–8.3)

## 2014-07-17 LAB — CBC WITH DIFFERENTIAL/PLATELET
Basophils Absolute: 0 10*3/uL (ref 0.0–0.1)
Basophils Relative: 1 % (ref 0–1)
EOS ABS: 0.1 10*3/uL (ref 0.0–0.7)
EOS PCT: 2 % (ref 0–5)
HCT: 33 % — ABNORMAL LOW (ref 36.0–46.0)
Hemoglobin: 10.7 g/dL — ABNORMAL LOW (ref 12.0–15.0)
LYMPHS ABS: 0.7 10*3/uL (ref 0.7–4.0)
Lymphocytes Relative: 18 % (ref 12–46)
MCH: 31.2 pg (ref 26.0–34.0)
MCHC: 32.4 g/dL (ref 30.0–36.0)
MCV: 96.2 fL (ref 78.0–100.0)
Monocytes Absolute: 0.5 10*3/uL (ref 0.1–1.0)
Monocytes Relative: 14 % — ABNORMAL HIGH (ref 3–12)
Neutro Abs: 2.4 10*3/uL (ref 1.7–7.7)
Neutrophils Relative %: 65 % (ref 43–77)
PLATELETS: 164 10*3/uL (ref 150–400)
RBC: 3.43 MIL/uL — ABNORMAL LOW (ref 3.87–5.11)
RDW: 16.9 % — AB (ref 11.5–15.5)
WBC: 3.7 10*3/uL — ABNORMAL LOW (ref 4.0–10.5)

## 2014-07-17 LAB — PROTIME-INR
INR: 0.98 (ref 0.00–1.49)
PROTHROMBIN TIME: 13.1 s (ref 11.6–15.2)

## 2014-07-17 LAB — APTT: aPTT: 27 seconds (ref 24–37)

## 2014-07-17 MED ORDER — MIDAZOLAM HCL 2 MG/2ML IJ SOLN
INTRAMUSCULAR | Status: AC | PRN
  Administered 2014-07-17 (×8): 1 mg via INTRAVENOUS

## 2014-07-17 MED ORDER — SODIUM CHLORIDE 0.9 % IV SOLN
INTRAVENOUS | Status: DC
  Administered 2014-07-17: 500 mL via INTRAVENOUS

## 2014-07-17 MED ORDER — SODIUM CHLORIDE 0.9 % IV SOLN: INTRAVENOUS | Status: DC

## 2014-07-17 MED ORDER — LIDOCAINE HCL 1 % IJ SOLN
INTRAMUSCULAR | Status: AC
Start: 2014-07-17 — End: 2014-07-17
  Filled 2014-07-17: qty 20

## 2014-07-17 MED ORDER — HEPARIN SOD (PORK) LOCK FLUSH 100 UNIT/ML IV SOLN
500.0000 [IU] | Freq: Once | INTRAVENOUS | Status: AC
  Administered 2014-07-17: 500 [IU] via INTRAVENOUS
  Filled 2014-07-17: qty 5

## 2014-07-17 MED ORDER — TECHNETIUM TO 99M ALBUMIN AGGREGATED
4.3000 | Freq: Once | INTRAVENOUS | Status: AC | PRN
  Administered 2014-07-17: 4.3 via INTRAVENOUS

## 2014-07-17 MED ORDER — MIDAZOLAM HCL 2 MG/2ML IJ SOLN
INTRAMUSCULAR | Status: AC
Start: 2014-07-17 — End: 2014-07-17
  Filled 2014-07-17: qty 4

## 2014-07-17 MED ORDER — FENTANYL CITRATE 0.05 MG/ML IJ SOLN
INTRAMUSCULAR | Status: AC | PRN
  Administered 2014-07-17 (×2): 25 ug via INTRAVENOUS
  Administered 2014-07-17: 50 ug via INTRAVENOUS
  Administered 2014-07-17: 25 ug via INTRAVENOUS
  Administered 2014-07-17: 50 ug via INTRAVENOUS
  Administered 2014-07-17: 25 ug via INTRAVENOUS

## 2014-07-17 MED ORDER — ANTICOAGULANT SODIUM CITRATE 4% (200MG/5ML) IV SOLN
5.0000 mL | Freq: Once | Status: DC
  Filled 2014-07-17: qty 250

## 2014-07-17 MED ORDER — FENTANYL CITRATE 0.05 MG/ML IJ SOLN
INTRAMUSCULAR | Status: AC
  Filled 2014-07-17: qty 6

## 2014-07-17 MED ORDER — SODIUM CHLORIDE 0.9 % IJ SOLN
10.0000 mL | Freq: Once | INTRAMUSCULAR | Status: AC
  Administered 2014-07-17: 10 mL

## 2014-07-17 MED ORDER — HYDROCODONE-ACETAMINOPHEN 5-325 MG PO TABS
1.0000 | ORAL_TABLET | ORAL | Status: DC | PRN
  Administered 2014-07-17 (×2): 1 via ORAL
  Filled 2014-07-17 (×2): qty 1

## 2014-07-17 MED ORDER — ANTICOAGULANT SODIUM CITRATE 4% (200MG/5ML) IV SOLN
5.0000 mL | Freq: Once | Status: DC
  Filled 2014-07-17: qty 5

## 2014-07-17 MED ORDER — MIDAZOLAM HCL 2 MG/2ML IJ SOLN
INTRAMUSCULAR | Status: AC
Start: 2014-07-17 — End: 2014-07-17
  Filled 2014-07-17: qty 6

## 2014-07-17 NOTE — Sedation Documentation (Signed)
5 Fr sheath removed from R femoral artery by Dr. Yamagata. Hemostasis achieved using Exoseal closure device. Groin level 0, 3+RDP.  

## 2014-07-17 NOTE — Sedation Documentation (Signed)
Transport to [facility] on bed with RN for recovery.

## 2014-07-17 NOTE — Procedures (Signed)
Procedure:  Celiac, common hepatic, gastroduodenal and left hepatic arteriography.  GDA embolization. Findings:  No variant anatomy.  Tumor supply via right and left hepatic arterial branches. GDA embolized with 3-5 mm coils. Tiny duodenal branch could not be catheterized. MAA administered in distal proper hepatic artery just before bifurcation. For NM scanning next with shunt calculation.

## 2014-07-17 NOTE — Sedation Documentation (Signed)
Gauze/tegaderm bandage applies to R fem art puncture, CDI. Level 0. 3+ RDP.

## 2014-07-17 NOTE — Discharge Instructions (Signed)
Pre Y 90, Care After Refer to this sheet in the next few weeks. These instructions provide you with information on caring for yourself after your procedure. Your health care provider may also give you more specific instructions. Your treatment has been planned according to current medical practices, but problems sometimes occur. Call your health care provider if you have any problems or questions after your procedure.  WHAT TO EXPECT AFTER THE PROCEDURE After your procedure, it is typical to have the following sensations:  Minor discomfort or tenderness and a small bump at the catheter insertion site. The bump should usually decrease in size and tenderness within 1 to 2 weeks.  Any bruising will usually fade within 2 to 4 weeks. HOME CARE INSTRUCTIONS    Do not take baths, swim, or use a hot tub until  24 hours pos tprocedure  You may shower 24 hours after the procedure. Remove the bandage (dressing) and gently wash the site with plain soap and water. Gently pat the site dry.  Inspect the site at least twice daily.  Limit your activity for the first 48 hours. Do not bend, squat, or lift anything over 20 lb (9 kg)   Take steps slowly  Plan to have someone take you home after the procedure. Follow instructions about when you can drive or return to work. SEEK MEDICAL CARE IF:  You get light-headed when standing up.  You have drainage (other than a small amount of blood on the dressing).  You have chills.  You have a fever.  You have redness, warmth, swelling, or pain at the insertion site. SEEK IMMEDIATE MEDICAL CARE IF:   You develop chest pain or shortness of breath, feel faint, or pass out.  You have bleeding, swelling larger than a walnut, or drainage from the catheter insertion site.  You develop pain, discoloration, coldness, or severe bruising in the leg or arm that held the catheter.  You develop bleeding from any other place, such as the bowels. You may see bright red  blood in your urine or stools, or your stools may appear black and tarry.  You have heavy bleeding from the site. If this happens, hold pressure on the site.                                                                                    Conscious Sedation, Adult, Care After Refer to this sheet in the next few weeks. These instructions provide you with information on caring for yourself after your procedure. Your health care provider may also give you more specific instructions. Your treatment has been planned according to current medical practices, but problems sometimes occur. Call your health care provider if you have any problems or questions after your procedure. WHAT TO EXPECT AFTER THE PROCEDURE  After your procedure:  You may feel sleepy, clumsy, and have poor balance for several hours.  Vomiting may occur if you eat too soon after the procedure. HOME CARE INSTRUCTIONS  Do not participate in any activities where you could become injured for at least 24 hours. Do not:  Drive.  Swim.  Ride a bicycle.  Operate heavy machinery.  Cook.  Use power tools.  Climb ladders.  Work from a high place.  Do not make important decisions or sign legal documents until you are improved.  If you vomit, drink water, juice, or soup when you can drink without vomiting. Make sure you have little or no nausea before eating solid foods.  Only take over-the-counter or prescription medicines for pain, discomfort, or fever as directed by your health care provider.  Make sure you and your family fully understand everything about the medicines given to you, including what side effects may occur.  You should not drink alcohol, take sleeping pills, or take medicines that cause drowsiness for at least 24 hours.  If you smoke, do not smoke without supervision.  If you are feeling better, you may resume normal activities 24 hours after you were sedated.  Keep all appointments with your health  care provider. SEEK MEDICAL CARE IF:  Your skin is pale or bluish in color.  You continue to feel nauseous or vomit.  Your pain is getting worse and is not helped by medicine.  You have bleeding or swelling.  You are still sleepy or feeling clumsy after 24 hours. SEEK IMMEDIATE MEDICAL CARE IF:  You develop a rash.  You have difficulty breathing.  You develop any type of allergic problem.  You have a fever. MAKE SURE YOU:  Understand these instructions.  Will watch your condition.  Will get help right away if you are not doing well or get worse. Document Released: 06/18/2013 Document Reviewed: 06/18/2013 [facility] Patient Information 2015 [place], [place]. This information is not intended to replace advice given to you by your health care provider. Make sure you discuss any questions you have with your health care provider.

## 2014-07-17 NOTE — H&P (Signed)
Chief Complaint: Metastatic pancreatic cancer  Referring Physician(s): Yamagata,Glenn T  History of Present Illness: Barbara Fuller is a 63 y.o. female with history of stage IV metastatic pancreatic adenocarcinoma and recent CT showing slight progression of hepatic metastatic disease despite chemotherapy. She presents today following IR consultation for hepatic/mesenteric arteriogram with embolization / Y-90 test dosing.  Past Medical History  Diagnosis Date  . Non-smoker   . Cancer     Metastatic pancreatic adenocarcinoma w/ liver metastses    . Anxiety   . History of blood clot in brain 2005    treated at [facility] unable to determine the cause and no signs of a stroke    Past Surgical History  Procedure Laterality Date  . Brain surgery      Pt denies this. Only had a clot on the brain that resolved with no residual  . Tubal ligation    . Tonsillectomy    . Portacath placement  02/2014  . Cardiac catheterization  03/06/2014    at [facility] / this was related to a heart spasm due to chemo    Allergies: Amoxicillin; Keflex; Other; and Penicillins  Medications: Prior to Admission medications   Medication Sig Start Date End Date Taking? Authorizing Provider  ALPRAZolam Duanne Moron) 0.5 MG tablet Take 0.25 mg by mouth at bedtime as needed for anxiety or sleep.   Yes Historical Provider, MD  isosorbide mononitrate (IMDUR) 30 MG 24 hr tablet Take 1 tablet (30 mg total) by mouth daily. 03/06/14  Yes Clent Demark, MD  lidocaine-prilocaine (EMLA) cream  03/03/14  Yes Historical Provider, MD  LORazepam (ATIVAN) 1 MG tablet  03/03/14  Yes Historical Provider, MD  nitroGLYCERIN (NITROSTAT) 0.4 MG SL tablet Place 1 tablet (0.4 mg total) under the tongue every 5 (five) minutes x 3 doses as needed for chest pain. 03/06/14  Yes Clent Demark, MD  ondansetron (ZOFRAN-ODT) 8 MG disintegrating tablet Take 8 mg by mouth every 8 (eight) hours as needed for nausea or vomiting.   Yes Historical Provider,  MD  aspirin EC 81 MG EC tablet Take 1 tablet (81 mg total) by mouth daily. 03/07/14   Clent Demark, MD  calcium elemental as carbonate (PX ANTACID MAXIMUM STRENGTH) 400 MG tablet Chew by mouth.    Historical Provider, MD  diphenhydrAMINE (BENADRYL) 25 mg capsule Take by mouth.    Historical Provider, MD  Multiple Vitamin (MULTIVITAMIN WITH MINERALS) TABS tablet Take 1 tablet by mouth daily.    Historical Provider, MD  naphazoline-glycerin (CLEAR EYES) 0.012-0.2 % SOLN Place 1-2 drops into both eyes every 4 (four) hours as needed for irritation.    Historical Provider, MD  Omega-3 Fatty Acids (FISH OIL) 1000 MG CAPS Take by mouth.    Historical Provider, MD  polyethylene glycol (MIRALAX / GLYCOLAX) packet Take 17 g by mouth daily.    Historical Provider, MD  riboflavin (VITAMIN B-2) 100 MG TABS tablet Take by mouth.    Historical Provider, MD  rosuvastatin (CRESTOR) 5 MG tablet Take 1 tablet (5 mg total) by mouth daily. 03/06/14   Clent Demark, MD  senna (SENOKOT) 8.6 MG tablet Take by mouth.    Historical Provider, MD  vitamin C (ASCORBIC ACID) 500 MG tablet Take 500 mg by mouth daily.    Historical Provider, MD    No family history on file.  History   Social History  . Marital Status: Married    Spouse Name: N/A    Number of  Children: N/A  . Years of Education: N/A   Social History Main Topics  . Smoking status: Never Smoker   . Smokeless tobacco: Not on file  . Alcohol Use: No  . Drug Use: No  . Sexual Activity: Not on file   Other Topics Concern  . Not on file   Social History Narrative         Review of Systems  Constitutional: Negative for fever and chills.  Respiratory: Negative for cough and shortness of breath.   Cardiovascular: Negative for chest pain.  Gastrointestinal: Negative for vomiting, abdominal pain and blood in stool.       Occ nausea from chemotherapy  Genitourinary: Negative for dysuria and hematuria.  Musculoskeletal: Negative for back pain.    Neurological: Negative for headaches.  Psychiatric/Behavioral: The patient is nervous/anxious.     Vital Signs: BP 139/85 mmHg  Pulse 91  Temp(Src) 98.3 F (36.8 C) (Oral)  Resp 18  Ht 5' 3.5" (1.613 m)  Wt 141 lb (63.957 kg)  BMI 24.58 kg/m2  SpO2 99%  Physical Exam  Constitutional: She is oriented to person, place, and time. She appears well-developed and well-nourished.  Cardiovascular: Normal rate and regular rhythm.   Pulmonary/Chest: Effort normal.  Sl dim BS rt base, left clear; clean, intact rt chest wall PAC  Abdominal: Soft. Bowel sounds are normal. There is no tenderness.  Musculoskeletal: Normal range of motion. She exhibits no edema.  Neurological: She is alert and oriented to person, place, and time.    Imaging: No results found.  Labs:  CBC:  Recent Labs  03/06/14 0330 07/17/14 0750  WBC 4.2 3.7*  HGB 12.4 10.7*  HCT 37.5 33.0*  PLT 194 164    COAGS:  Recent Labs  03/06/14 0330 07/17/14 0750  INR 1.00 0.98  APTT  --  27    BMP:  Recent Labs  03/06/14 0330  NA 137  K 3.9  CL 97  CO2 25  GLUCOSE 106*  BUN 15  CALCIUM 9.2  CREATININE 0.56  GFRNONAA >90  GFRAA >90    LIVER FUNCTION TESTS:  Recent Labs  03/06/14 0330  BILITOT 0.7  AST 145*  ALT 206*  ALKPHOS 217*  PROT 6.1  ALBUMIN 3.0*    TUMOR MARKERS: No results for input(s): AFPTM, CEA, CA199, CHROMGRNA in the last 8760 hours.  Assessment and Plan: Barbara Fuller is a 63 y.o. female with history of stage IV metastatic pancreatic adenocarcinoma and recent CT showing slight progression of hepatic metastatic disease despite chemotherapy. She presents today following IR consultation for hepatic/mesenteric arteriogram with embolization / Y-90 test dosing. Details/risks of procedure d/w pt with her understanding and consent.            Signed: Autumn Messing 07/17/2014, 8:24 AM

## 2014-07-21 ENCOUNTER — Other Ambulatory Visit (HOSPITAL_COMMUNITY): Payer: BC Managed Care – PPO

## 2014-07-21 ENCOUNTER — Encounter (HOSPITAL_COMMUNITY): Payer: BC Managed Care – PPO

## 2014-07-22 ENCOUNTER — Other Ambulatory Visit (HOSPITAL_COMMUNITY): Payer: Self-pay | Admitting: Interventional Radiology

## 2014-07-22 ENCOUNTER — Other Ambulatory Visit: Payer: Self-pay | Admitting: Interventional Radiology

## 2014-07-22 DIAGNOSIS — C259 Malignant neoplasm of pancreas, unspecified: Secondary | ICD-10-CM

## 2014-07-22 DIAGNOSIS — C787 Secondary malignant neoplasm of liver and intrahepatic bile duct: Principal | ICD-10-CM

## 2014-07-27 ENCOUNTER — Other Ambulatory Visit: Payer: Self-pay | Admitting: Radiology

## 2014-07-28 ENCOUNTER — Ambulatory Visit (HOSPITAL_COMMUNITY)
Admission: RE | Admit: 2014-07-28 | Discharge: 2014-07-28 | Disposition: A | Discharge status: Home / Self Care | Payer: BC Managed Care – PPO | Source: Ambulatory Visit | Attending: Interventional Radiology | Admitting: Interventional Radiology

## 2014-07-28 ENCOUNTER — Encounter (HOSPITAL_COMMUNITY)
Admission: RE | Admit: 2014-07-28 | Discharge: 2014-07-28 | Disposition: A | Discharge status: Home / Self Care | Payer: BC Managed Care – PPO | Source: Ambulatory Visit | Attending: Interventional Radiology | Admitting: Interventional Radiology

## 2014-07-28 ENCOUNTER — Encounter (HOSPITAL_COMMUNITY): Payer: Self-pay

## 2014-07-28 ENCOUNTER — Other Ambulatory Visit: Payer: Self-pay | Admitting: Interventional Radiology

## 2014-07-28 DIAGNOSIS — C259 Malignant neoplasm of pancreas, unspecified: Secondary | ICD-10-CM | POA: Diagnosis present

## 2014-07-28 DIAGNOSIS — C787 Secondary malignant neoplasm of liver and intrahepatic bile duct: Secondary | ICD-10-CM | POA: Diagnosis not present

## 2014-07-28 DIAGNOSIS — Z7982 Long term (current) use of aspirin: Secondary | ICD-10-CM | POA: Insufficient documentation

## 2014-07-28 DIAGNOSIS — Z79899 Other long term (current) drug therapy: Secondary | ICD-10-CM | POA: Diagnosis not present

## 2014-07-28 LAB — CBC WITH DIFFERENTIAL/PLATELET
BASOS ABS: 0 10*3/uL (ref 0.0–0.1)
Basophils Relative: 1 % (ref 0–1)
EOS PCT: 1 % (ref 0–5)
Eosinophils Absolute: 0.1 10*3/uL (ref 0.0–0.7)
HEMATOCRIT: 34 % — AB (ref 36.0–46.0)
Hemoglobin: 11 g/dL — ABNORMAL LOW (ref 12.0–15.0)
LYMPHS ABS: 0.8 10*3/uL (ref 0.7–4.0)
LYMPHS PCT: 14 % (ref 12–46)
MCH: 30.9 pg (ref 26.0–34.0)
MCHC: 32.4 g/dL (ref 30.0–36.0)
MCV: 95.5 fL (ref 78.0–100.0)
MONO ABS: 0.7 10*3/uL (ref 0.1–1.0)
Monocytes Relative: 13 % — ABNORMAL HIGH (ref 3–12)
NEUTROS ABS: 4 10*3/uL (ref 1.7–7.7)
Neutrophils Relative %: 71 % (ref 43–77)
Platelets: 284 10*3/uL (ref 150–400)
RBC: 3.56 MIL/uL — AB (ref 3.87–5.11)
RDW: 16.2 % — AB (ref 11.5–15.5)
WBC: 5.6 10*3/uL (ref 4.0–10.5)

## 2014-07-28 LAB — COMPREHENSIVE METABOLIC PANEL
ALT: 57 U/L — AB (ref 0–35)
AST: 64 U/L — AB (ref 0–37)
Albumin: 3.4 g/dL — ABNORMAL LOW (ref 3.5–5.2)
Alkaline Phosphatase: 379 U/L — ABNORMAL HIGH (ref 39–117)
Anion gap: 13 (ref 5–15)
BILIRUBIN TOTAL: 0.6 mg/dL (ref 0.3–1.2)
BUN: 13 mg/dL (ref 6–23)
CALCIUM: 9.4 mg/dL (ref 8.4–10.5)
CHLORIDE: 101 meq/L (ref 96–112)
CO2: 27 meq/L (ref 19–32)
CREATININE: 0.66 mg/dL (ref 0.50–1.10)
GFR calc Af Amer: >90 mL/min (ref >90)
GFR calc non Af Amer: >90 mL/min (ref >90)
Glucose, Bld: 108 mg/dL — ABNORMAL HIGH (ref 70–99)
Potassium: 4.2 mEq/L (ref 3.7–5.3)
Sodium: 141 mEq/L (ref 137–147)
Total Protein: 6.9 g/dL (ref 6.0–8.3)

## 2014-07-28 LAB — PROTIME-INR
INR: 0.99 (ref 0.00–1.49)
Prothrombin Time: 13.2 seconds (ref 11.6–15.2)

## 2014-07-28 MED ORDER — FENTANYL CITRATE 0.05 MG/ML IJ SOLN
INTRAMUSCULAR | Status: AC
  Filled 2014-07-28: qty 4

## 2014-07-28 MED ORDER — MIDAZOLAM HCL 2 MG/2ML IJ SOLN
INTRAMUSCULAR | Status: AC | PRN
  Administered 2014-07-28 (×4): 1 mg via INTRAVENOUS

## 2014-07-28 MED ORDER — HYDROMORPHONE HCL 1 MG/ML IJ SOLN
1.0000 mg | INTRAMUSCULAR | Status: DC | PRN
  Administered 2014-07-28: 1 mg via INTRAVENOUS
  Filled 2014-07-28: qty 1

## 2014-07-28 MED ORDER — DIPHENHYDRAMINE HCL 50 MG/ML IJ SOLN
INTRAMUSCULAR | Status: AC
  Filled 2014-07-28: qty 1

## 2014-07-28 MED ORDER — ONDANSETRON HCL 4 MG/2ML IJ SOLN
4.0000 mg | Freq: Once | INTRAMUSCULAR | Status: AC
  Administered 2014-07-28: 4 mg via INTRAVENOUS
  Filled 2014-07-28: qty 2

## 2014-07-28 MED ORDER — IOHEXOL 300 MG/ML  SOLN
80.0000 mL | Freq: Once | INTRAMUSCULAR | Status: AC | PRN
  Administered 2014-07-28: 95 mL via INTRA_ARTERIAL

## 2014-07-28 MED ORDER — SODIUM CHLORIDE 0.9 % IV SOLN
INTRAVENOUS | Status: DC
  Administered 2014-07-28: 08:00:00 via INTRAVENOUS

## 2014-07-28 MED ORDER — LIDOCAINE HCL 1 % IJ SOLN
INTRAMUSCULAR | Status: AC
  Filled 2014-07-28: qty 20

## 2014-07-28 MED ORDER — CIPROFLOXACIN IN D5W 400 MG/200ML IV SOLN
400.0000 mg | Freq: Once | INTRAVENOUS | Status: AC
  Administered 2014-07-28: 400 mg via INTRAVENOUS
  Filled 2014-07-28: qty 200

## 2014-07-28 MED ORDER — FENTANYL CITRATE 0.05 MG/ML IJ SOLN
INTRAMUSCULAR | Status: AC | PRN
  Administered 2014-07-28 (×3): 50 ug via INTRAVENOUS

## 2014-07-28 MED ORDER — MIDAZOLAM HCL 2 MG/2ML IJ SOLN
INTRAMUSCULAR | Status: AC
  Filled 2014-07-28: qty 8

## 2014-07-28 MED ORDER — DEXAMETHASONE SODIUM PHOSPHATE 10 MG/ML IJ SOLN
10.0000 mg | Freq: Once | INTRAMUSCULAR | Status: AC
  Administered 2014-07-28: 10 mg via INTRAVENOUS
  Filled 2014-07-28: qty 1

## 2014-07-28 MED ORDER — YTTRIUM 90 INJECTION
26.9000 | INJECTION | Freq: Once | INTRAVENOUS | Status: AC
  Administered 2014-07-28: 26.9 via INTRAVENOUS

## 2014-07-28 MED ORDER — VANCOMYCIN HCL IN DEXTROSE 1-5 GM/200ML-% IV SOLN
1000.0000 mg | Freq: Once | INTRAVENOUS | Status: AC
  Administered 2014-07-28: 1000 mg via INTRAVENOUS
  Filled 2014-07-28: qty 200

## 2014-07-28 MED ORDER — DIPHENHYDRAMINE HCL 50 MG/ML IJ SOLN
INTRAMUSCULAR | Status: AC | PRN
  Administered 2014-07-28: 25 mg via INTRAVENOUS

## 2014-07-28 MED ORDER — PANTOPRAZOLE SODIUM 40 MG IV SOLR
40.0000 mg | Freq: Once | INTRAVENOUS | Status: AC
  Administered 2014-07-28: 40 mg via INTRAVENOUS
  Filled 2014-07-28: qty 40

## 2014-07-28 MED ORDER — HEPARIN SOD (PORK) LOCK FLUSH 100 UNIT/ML IV SOLN
500.0000 [IU] | INTRAVENOUS | Status: AC | PRN
  Administered 2014-07-28: 500 [IU]
  Filled 2014-07-28: qty 5

## 2014-07-28 MED ORDER — DIPHENHYDRAMINE HCL 50 MG/ML IJ SOLN
25.0000 mg | Freq: Once | INTRAMUSCULAR | Status: AC
  Administered 2014-07-28: 25 mg via INTRAVENOUS

## 2014-07-28 MED ORDER — SODIUM CHLORIDE 0.9 % IV SOLN: INTRAVENOUS | Status: DC

## 2014-07-28 NOTE — Discharge Instructions (Signed)
Post Y-90 Radioembolization Discharge Instructions  You have been given a radioactive material during your procedure.  While it is safe for you to be discharged home from the hospital, you need to proceed directly home.    Do not use public transportation, including air travel, lasting more than 2 hours for 1 week.  Avoid crowded public places for 1 week.  Adult visitors should try to avoid close contact with you for 1 week.    Children and pregnant females should not visit or have close contact with you for 1 week.  Items that you touch are not radioactive.  Do not sleep in the same bed as your partner for 1 week, and a condom should be used for sexual activity during the first 24 hours.  Your blood may be radioactive and caution should be used if any bleeding occurs during the recovery period.  Body fluids may be radioactive for 24 hours.  Wash your hands after voiding.  Men should sit to urinate.  Dispose of any soiled materials (flush down toilet or place in trash at home) during the first day.  Drink 6 to 8 glasses of fluids per day for 5 days to hydrate yourself.  If you need to see a doctor during the first week, you must let them know that you were treated with yttrium-90 microspheres, and will be slightly radioactive.  They can call Interventional Radiology [phone] with any questions.Arteriogram Care After These instructions give you information on caring for yourself after your procedure. Your doctor may also give you more specific instructions. Call your doctor if you have any problems or questions after your procedure. HOME CARE  Keep your leg straight for at least 6 hours.  Do not bathe, swim, or use a hot tub until directed by your doctor. You can shower.  Do not lift anything heavier than 10 pounds (about a gallon of milk) for 2 days.  Do not walk a lot, run, or drive for 2 days.  Return to normal activities in 2 days or as told by your doctor. Finding out the  results of your test Ask when your test results will be ready. Make sure you get your test results. GET HELP RIGHT AWAY IF:   You have fever.  You have more pain in your leg.  The leg that was cut is:  Bleeding.  Puffy (swollen) or red.  Cold.  Pale or changes color.  Weak.  Tingly or numb. If you go to the Emergency Room, tell your nurse that you have had an arteriogram. Take this paper with you to show the nurse. MAKE SURE YOU:  Understand these instructions.  Will watch your condition.  Will get help right away if you are not doing well or get worse. Document Released: 11/24/2008 Document Revised: 09/02/2013 Document Reviewed: 11/24/2008 [facility] Patient Information 2015 [place], [place]. This information is not intended to replace advice given to you by your health care provider. Make sure you discuss any questions you have with your health care provider. Conscious Sedation Sedation is the use of medicines to promote relaxation and relieve discomfort and anxiety. Conscious sedation is a type of sedation. Under conscious sedation you are less alert than normal but are still able to respond to instructions or stimulation. Conscious sedation is used during short medical and dental procedures. It is milder than deep sedation or general anesthesia and allows you to return to your regular activities sooner.  LET [facility] CARE PROVIDER KNOW ABOUT:  Any allergies you have.  All medicines you are taking, including vitamins, herbs, eye drops, creams, and over-the-counter medicines.  Use of steroids (by mouth or creams).  Previous problems you or members of your family have had with the use of anesthetics.  Any blood disorders you have.  Previous surgeries you have had.  Medical conditions you have.  Possibility of pregnancy, if this applies.  Use of cigarettes, alcohol, or illegal drugs. RISKS AND COMPLICATIONS Generally, this is a safe procedure. However, as with  any procedure, problems can occur. Possible problems include:  Oversedation.  Trouble breathing on your own. You may need to have a breathing tube until you are awake and breathing on your own.  Allergic reaction to any of the medicines used for the procedure. BEFORE THE PROCEDURE  You may have blood tests done. These tests can help show how well your kidneys and liver are working. They can also show how well your blood clots.  A physical exam will be done.  Only take medicines as directed by your health care provider. You may need to stop taking medicines (such as blood thinners, aspirin, or nonsteroidal anti-inflammatory drugs) before the procedure.   Do not eat or drink at least 6 hours before the procedure or as directed by your health care provider.  Arrange for a responsible adult, family member, or friend to take you home after the procedure. He or she should stay with you for at least 24 hours after the procedure, until the medicine has worn off. PROCEDURE   An intravenous (IV) catheter will be inserted into one of your veins. Medicine will be able to flow directly into your body through this catheter. You may be given medicine through this tube to help prevent pain and help you relax.  The medical or dental procedure will be done. AFTER THE PROCEDURE  You will stay in a recovery area until the medicine has worn off. Your blood pressure and pulse will be checked.   Depending on the procedure you had, you may be allowed to go home when you can tolerate liquids and your pain is under control. Document Released: 05/23/2001 Document Revised: 09/02/2013 Document Reviewed: 05/05/2013 [facility] Patient Information 2015 [place], [place]. This information is not intended to replace advice given to you by your health care provider. Make sure you discuss any questions you have with your health care provider. Conscious Sedation, Adult, Care After Refer to this sheet in the next few  weeks. These instructions provide you with information on caring for yourself after your procedure. Your health care provider may also give you more specific instructions. Your treatment has been planned according to current medical practices, but problems sometimes occur. Call your health care provider if you have any problems or questions after your procedure. WHAT TO EXPECT AFTER THE PROCEDURE  After your procedure: You may feel sleepy, clumsy, and have poor balance for several hours. Vomiting may occur if you eat too soon after the procedure. HOME CARE INSTRUCTIONS Do not participate in any activities where you could become injured for at least 24 hours. Do not: Drive. Swim. Ride a bicycle. Operate heavy machinery. Cook. Use power tools. Climb ladders. Work from a high place. Do not make important decisions or sign legal documents until you are improved. If you vomit, drink water, juice, or soup when you can drink without vomiting. Make sure you have little or no nausea before eating solid foods. Only take over-the-counter or prescription medicines for pain, discomfort, or  fever as directed by your health care provider. Make sure you and your family fully understand everything about the medicines given to you, including what side effects may occur. You should not drink alcohol, take sleeping pills, or take medicines that cause drowsiness for at least 24 hours. If you smoke, do not smoke without supervision. If you are feeling better, you may resume normal activities 24 hours after you were sedated. Keep all appointments with your health care provider. SEEK MEDICAL CARE IF: Your skin is pale or bluish in color. You continue to feel nauseous or vomit. Your pain is getting worse and is not helped by medicine. You have bleeding or swelling. You are still sleepy or feeling clumsy after 24 hours. SEEK IMMEDIATE MEDICAL CARE IF: You develop a rash. You have difficulty breathing. You  develop any type of allergic problem. You have a fever. MAKE SURE YOU: Understand these instructions. Will watch your condition. Will get help right away if you are not doing well or get worse. Document Released: 06/18/2013 Document Reviewed: 06/18/2013 [facility] Patient Information 2015 [place], [place]. This information is not intended to replace advice given to you by your health care provider. Make sure you discuss any questions you have with your health care provider.

## 2014-07-28 NOTE — H&P (Signed)
Chief Complaint: Metastatic  pancreatic cancer  Referring Physician(s): Dr. Jacquiline Doe  History of Present Illness: Barbara Fuller is a 63 y.o. female with history of stage IV metastatic pancreatic adenocarcinoma and recent CT showing slight progression of hepatic metastatic disease despite chemotherapy. She presents today following IR consultation for hepatic Y90 radioembolization.  Past Medical History  Diagnosis Date  . Non-smoker   . Cancer     Metastatic pancreatic adenocarcinoma w/ liver metastses    . Anxiety   . History of blood clot in brain 2005    treated at [facility] unable to determine the cause and no signs of a stroke    Past Surgical History  Procedure Laterality Date  . Brain surgery      Pt denies this. Only had a clot on the brain that resolved with no residual  . Tubal ligation    . Tonsillectomy    . Portacath placement  02/2014  . Cardiac catheterization  03/06/2014    at [facility] / this was related to a heart spasm due to chemo    Allergies: Amoxicillin; Cashew nut oil; Influenza vaccines; Keflex; Other; Peanut-containing drug products; Penicillins; Red dye; and Tape  Medications: Prior to Admission medications   Medication Sig Start Date End Date Taking? Authorizing Provider  ALPRAZolam Duanne Moron) 0.5 MG tablet Take 0.25 mg by mouth at bedtime as needed for anxiety or sleep.   Yes Historical Provider, MD  calcium elemental as carbonate (PX ANTACID MAXIMUM STRENGTH) 400 MG tablet Chew 1,000 mg by mouth 2 (two) times daily as needed for heartburn.    Yes Historical Provider, MD  lidocaine-prilocaine (EMLA) cream Apply 1 application topically daily as needed (Chemo treatments).  03/03/14  Yes Historical Provider, MD  LORazepam (ATIVAN) 1 MG tablet Take 1 mg by mouth every 8 (eight) hours as needed for anxiety (Nausea).  03/03/14  Yes Historical Provider, MD  morphine (MSIR) 15 MG tablet Take 15 mg by mouth at bedtime.   Yes Historical Provider, MD  ondansetron  (ZOFRAN-ODT) 8 MG disintegrating tablet Take 8 mg by mouth every 8 (eight) hours as needed for nausea or vomiting.   Yes Historical Provider, MD  polyethylene glycol (MIRALAX / GLYCOLAX) packet Take 17 g by mouth daily.   Yes Historical Provider, MD  rosuvastatin (CRESTOR) 5 MG tablet Take 1 tablet (5 mg total) by mouth daily. 03/06/14  Yes Clent Demark, MD  aspirin EC 81 MG EC tablet Take 1 tablet (81 mg total) by mouth daily. Patient not taking: Reported on 07/24/2014 03/07/14   Clent Demark, MD  diphenhydrAMINE (BENADRYL) 25 mg capsule Take 25 mg by mouth daily as needed for itching or allergies.     Historical Provider, MD  isosorbide mononitrate (IMDUR) 30 MG 24 hr tablet Take 1 tablet (30 mg total) by mouth daily. Patient not taking: Reported on 07/24/2014 03/06/14   Clent Demark, MD  naphazoline-glycerin (CLEAR EYES) 0.012-0.2 % SOLN Place 1-2 drops into both eyes every 4 (four) hours as needed for irritation.    Historical Provider, MD  nitroGLYCERIN (NITROSTAT) 0.4 MG SL tablet Place 1 tablet (0.4 mg total) under the tongue every 5 (five) minutes x 3 doses as needed for chest pain. Patient not taking: Reported on 07/24/2014 03/06/14   Clent Demark, MD    History reviewed. No pertinent family history.  History   Social History  . Marital Status: Married    Spouse Name: N/A    Number of Children:  N/A  . Years of Education: N/A   Social History Main Topics  . Smoking status: Never Smoker   . Smokeless tobacco: None  . Alcohol Use: No  . Drug Use: No  . Sexual Activity: None   Other Topics Concern  . None   Social History Narrative         Review of Systems  Constitutional: Negative for fever and chills.  Respiratory: Negative for cough and shortness of breath.   Cardiovascular: Negative for chest pain.  Gastrointestinal: Negative for vomiting, abdominal pain and blood in stool.       Occ nausea  Genitourinary: Negative for dysuria and hematuria.    Musculoskeletal: Negative for back pain.  Neurological: Negative for headaches.  Hematological: Does not bruise/bleed easily.  Psychiatric/Behavioral: The patient is nervous/anxious.     Vital Signs: BP 140/87 mmHg  Pulse 91  Temp(Src) 98 F (36.7 C) (Oral)  Resp 18  Ht 5' 3.5" (1.613 m)  Wt 141 lb (63.957 kg)  BMI 24.58 kg/m2  SpO2 98%  Physical Exam  Constitutional: She is oriented to person, place, and time. She appears well-developed and well-nourished.  Cardiovascular: Normal rate and regular rhythm.   Pulmonary/Chest:  Sl dim BS bases; clean, intact rt chest wall PAC  Abdominal: Soft. Bowel sounds are normal. There is no tenderness.  Musculoskeletal: Normal range of motion. She exhibits no edema.  Neurological: She is alert and oriented to person, place, and time.    Imaging: Nm Liver Img Spect  07/17/2014   CLINICAL DATA:  History of pancreatic cancer with unresectable liver metastasis.  EXAM: NUCLEAR MEDICINE LIVER SCAN; ULTRASOUND MISCELLANEOUS SOFT TISSUE  TECHNIQUE: Abdominal images were obtained in multiple projections after intrahepatic arterial injection of radiopharmaceutical. SPECT imaging was performed. Lung shunt calculation was performed.  RADIOPHARMACEUTICALS:  4.3MILLI CURIE MAA TECHNETIUM TO 61M ALBUMIN AGGREGATED  COMPARISON:  CT from 04/29/2014.  FINDINGS: The injected microaggregated albumin localizes within the liver. No evidence of activity within the stomach, duodenum, or bowel.  Calculated shunt fraction to the lungs equals 5.7%.  IMPRESSION: 1. No significant extrahepatic radiotracer activity following intrahepatic arterial injection of MAA. 2. Lung shunt fraction equals 5.7%   Electronically Signed   By: Kerby Moors M.D.   On: 07/17/2014 14:03   Ir Angiogram Visceral Selective  07/17/2014   CLINICAL DATA:  Pancreatic carcinoma with metastatic disease to the liver. The patient presents for arteriographic mapping, embolization and nuclear medicine  imaging prior to planned radioembolization with Yttrium 90 microspheres.  INDICATION: Radioembolization of liver metastases  EXAM: 1. ULTRASOUND GUIDANCE FOR VASCULAR ACCESS OF THE RIGHT COMMON FEMORAL ARTERY 2. SELECTIVE ARTERIOGRAPHY OF THE CELIAC AXIS 3. SELECTIVE ARTERIOGRAPHY OF THE COMMON HEPATIC ARTERY 4. SELECTIVE ARTERIOGRAPHY OF THE LEFT HEPATIC ARTERY 5. SELECTIVE ARTERIOGRAPHY OF THE GASTRODUODENAL ARTERY 6. TRANSCATHETER EMBOLIZATION OF THE GASTRODUODENAL ARTERY 7. FOLLOWUP ARTERIOGRAPHY AFTER EMBOLIZATION  ANESTHESIA/SEDATION: Sedation:  8.0 mg IV Versed sec, 200 mcg IV fentanyl.  Total Moderate Sedation Time:  106 minutes.  MEDICATIONS: No additional medications.  RADIOPHARMACEUTICALS:  5 millicuries technetium 82-C MAA injected into the proper hepatic artery.  CONTRAST:  80 mL Omnipaque 300  FLUOROSCOPY TIME:  21 min and 6 seconds.  ACCESS: Right common femoral artery.  PROCEDURE: Informed consent was obtained from the patient prior to the procedure. Maximal barrier sterile technique was utilized including caps, mask, sterile gowns, sterile gloves, sterile drape, hand hygiene and skin antiseptic. A time-out procedure was performed prior to the procedure.  The right  groin was prepped with chlorhexidine. Ultrasound was used to confirm patency of the right common femoral artery and localize the artery for access. Local anesthesia was provided with 1% lidocaine. Under ultrasound guidance, a 21 gauge needle was advanced into the artery and access secured with a micropuncture set. Over a guidewire, a 5 French sheath was placed.  A 5 [place] cobra catheter was advanced into the abdominal aorta. This is used to selectively catheterize the celiac axis. Selective arteriography was performed. The catheter was further advanced into the common hepatic artery. Selective arteriography was performed through the 5 French catheter positioned in the common hepatic artery.  A micro catheter was then introduced through  the Cobra catheter and used to selectively catheterize the gastroduodenal artery. Selective arteriography was performed through the micro catheter. Transcatheter embolization of the gastroduodenal artery was then performed with advancement of interlock 0.018 inch embolization coils several coils were utilized ranging in diameter from as small as 3 mm up to 5 mm in diameter. After embolization, additional arteriography was performed at the level of the GDA origin as well as through the 5 French catheter positioned in the common hepatic artery.  A micro catheter was then advanced into the proper hepatic artery and further into the left hepatic artery. Selective arteriography was performed of the left hepatic artery. The catheter was retracted to the level of the distal proper hepatic artery and technetium 99-m MAA injection performed.  After the procedure, catheters were removed. Oblique arteriography was performed at the level of right groin access. Arteriotomy closure was performed with the Cordis ExoSeal device.  COMPLICATIONS: None.  FINDINGS: Celiac arteriography shows a widely patent celiac axis supplying typical anatomy with patent left gastric artery identified off of the celiac trunk and normally patent common hepatic and splenic arteries present. The gastroduodenal artery is patent and supplies the gastroepiploic artery as well as multiple pancreaticoduodenal branches. The proper hepatic artery splits into left and right hepatic arteries. No right gastric artery was identified. The cystic artery was visualized.  Right and left hepatic arteries supply multiple irregular distal branches which visibly supply metastatic lesions. The metastatic lesions demonstrate peripheral hypervascular enhancement. No visible shunting or arterial pseudoaneurysms identified. The left hepatic arterial distribution is fairly broad, consistent with the enlarged left hepatic lobe present by CT.  Embolization of the gastroduodenal  artery was successful in occluding the artery. During embolization, some of the coil mass also enter the proximal origin of a prominent pancreaticoduodenal branch resulting in complete occlusion. There was a tiny additional pancreaticoduodenal branch identified emanating off of the distal common hepatic artery. This vessel could not be selectively catheterized.  Arteriotomy closure was successful. The patient was transported to nuclear medicine for additional imaging of the liver as well as calculation of any shunting of injected MAA activity to the lungs.  IMPRESSION: Arteriography demonstrates hypervascular tumor supply in both right and left lobes of the liver. Prior to planned radioembolization treatment, the gastroduodenal artery was successfully embolized with coils. A tiny pancreaticoduodenal branch off of the common hepatic artery could not be selectively catheterized.  PLAN: Yttrium 90 radioembolization is scheduled for 07/28/2014. Planned will be to perform right lobe treatment at that time followed by left lobe treatment in 4 weeks.   Electronically Signed   By: Aletta Edouard M.D.   On: 07/17/2014 14:29   Ir Angiogram Selective Each Additional Vessel  07/17/2014   CLINICAL DATA:  Pancreatic carcinoma with metastatic disease to the liver. The patient presents for arteriographic  mapping, embolization and nuclear medicine imaging prior to planned radioembolization with Yttrium 90 microspheres.  INDICATION: Radioembolization of liver metastases  EXAM: 1. ULTRASOUND GUIDANCE FOR VASCULAR ACCESS OF THE RIGHT COMMON FEMORAL ARTERY 2. SELECTIVE ARTERIOGRAPHY OF THE CELIAC AXIS 3. SELECTIVE ARTERIOGRAPHY OF THE COMMON HEPATIC ARTERY 4. SELECTIVE ARTERIOGRAPHY OF THE LEFT HEPATIC ARTERY 5. SELECTIVE ARTERIOGRAPHY OF THE GASTRODUODENAL ARTERY 6. TRANSCATHETER EMBOLIZATION OF THE GASTRODUODENAL ARTERY 7. FOLLOWUP ARTERIOGRAPHY AFTER EMBOLIZATION  ANESTHESIA/SEDATION: Sedation:  8.0 mg IV Versed sec, 200 mcg IV  fentanyl.  Total Moderate Sedation Time:  106 minutes.  MEDICATIONS: No additional medications.  RADIOPHARMACEUTICALS:  5 millicuries technetium 54-Y MAA injected into the proper hepatic artery.  CONTRAST:  80 mL Omnipaque 300  FLUOROSCOPY TIME:  21 min and 6 seconds.  ACCESS: Right common femoral artery.  PROCEDURE: Informed consent was obtained from the patient prior to the procedure. Maximal barrier sterile technique was utilized including caps, mask, sterile gowns, sterile gloves, sterile drape, hand hygiene and skin antiseptic. A time-out procedure was performed prior to the procedure.  The right groin was prepped with chlorhexidine. Ultrasound was used to confirm patency of the right common femoral artery and localize the artery for access. Local anesthesia was provided with 1% lidocaine. Under ultrasound guidance, a 21 gauge needle was advanced into the artery and access secured with a micropuncture set. Over a guidewire, a 5 French sheath was placed.  A 5 [place] cobra catheter was advanced into the abdominal aorta. This is used to selectively catheterize the celiac axis. Selective arteriography was performed. The catheter was further advanced into the common hepatic artery. Selective arteriography was performed through the 5 French catheter positioned in the common hepatic artery.  A micro catheter was then introduced through the Cobra catheter and used to selectively catheterize the gastroduodenal artery. Selective arteriography was performed through the micro catheter. Transcatheter embolization of the gastroduodenal artery was then performed with advancement of interlock 0.018 inch embolization coils several coils were utilized ranging in diameter from as small as 3 mm up to 5 mm in diameter. After embolization, additional arteriography was performed at the level of the GDA origin as well as through the 5 French catheter positioned in the common hepatic artery.  A micro catheter was then advanced into the  proper hepatic artery and further into the left hepatic artery. Selective arteriography was performed of the left hepatic artery. The catheter was retracted to the level of the distal proper hepatic artery and technetium 99-m MAA injection performed.  After the procedure, catheters were removed. Oblique arteriography was performed at the level of right groin access. Arteriotomy closure was performed with the Cordis ExoSeal device.  COMPLICATIONS: None.  FINDINGS: Celiac arteriography shows a widely patent celiac axis supplying typical anatomy with patent left gastric artery identified off of the celiac trunk and normally patent common hepatic and splenic arteries present. The gastroduodenal artery is patent and supplies the gastroepiploic artery as well as multiple pancreaticoduodenal branches. The proper hepatic artery splits into left and right hepatic arteries. No right gastric artery was identified. The cystic artery was visualized.  Right and left hepatic arteries supply multiple irregular distal branches which visibly supply metastatic lesions. The metastatic lesions demonstrate peripheral hypervascular enhancement. No visible shunting or arterial pseudoaneurysms identified. The left hepatic arterial distribution is fairly broad, consistent with the enlarged left hepatic lobe present by CT.  Embolization of the gastroduodenal artery was successful in occluding the artery. During embolization, some of the coil mass also enter  the proximal origin of a prominent pancreaticoduodenal branch resulting in complete occlusion. There was a tiny additional pancreaticoduodenal branch identified emanating off of the distal common hepatic artery. This vessel could not be selectively catheterized.  Arteriotomy closure was successful. The patient was transported to nuclear medicine for additional imaging of the liver as well as calculation of any shunting of injected MAA activity to the lungs.  IMPRESSION: Arteriography  demonstrates hypervascular tumor supply in both right and left lobes of the liver. Prior to planned radioembolization treatment, the gastroduodenal artery was successfully embolized with coils. A tiny pancreaticoduodenal branch off of the common hepatic artery could not be selectively catheterized.  PLAN: Yttrium 90 radioembolization is scheduled for 07/28/2014. Planned will be to perform right lobe treatment at that time followed by left lobe treatment in 4 weeks.   Electronically Signed   By: Aletta Edouard M.D.   On: 07/17/2014 14:29   Ir Angiogram Selective Each Additional Vessel  07/17/2014   CLINICAL DATA:  Pancreatic carcinoma with metastatic disease to the liver. The patient presents for arteriographic mapping, embolization and nuclear medicine imaging prior to planned radioembolization with Yttrium 90 microspheres.  INDICATION: Radioembolization of liver metastases  EXAM: 1. ULTRASOUND GUIDANCE FOR VASCULAR ACCESS OF THE RIGHT COMMON FEMORAL ARTERY 2. SELECTIVE ARTERIOGRAPHY OF THE CELIAC AXIS 3. SELECTIVE ARTERIOGRAPHY OF THE COMMON HEPATIC ARTERY 4. SELECTIVE ARTERIOGRAPHY OF THE LEFT HEPATIC ARTERY 5. SELECTIVE ARTERIOGRAPHY OF THE GASTRODUODENAL ARTERY 6. TRANSCATHETER EMBOLIZATION OF THE GASTRODUODENAL ARTERY 7. FOLLOWUP ARTERIOGRAPHY AFTER EMBOLIZATION  ANESTHESIA/SEDATION: Sedation:  8.0 mg IV Versed sec, 200 mcg IV fentanyl.  Total Moderate Sedation Time:  106 minutes.  MEDICATIONS: No additional medications.  RADIOPHARMACEUTICALS:  5 millicuries technetium 40-H MAA injected into the proper hepatic artery.  CONTRAST:  80 mL Omnipaque 300  FLUOROSCOPY TIME:  21 min and 6 seconds.  ACCESS: Right common femoral artery.  PROCEDURE: Informed consent was obtained from the patient prior to the procedure. Maximal barrier sterile technique was utilized including caps, mask, sterile gowns, sterile gloves, sterile drape, hand hygiene and skin antiseptic. A time-out procedure was performed prior to the  procedure.  The right groin was prepped with chlorhexidine. Ultrasound was used to confirm patency of the right common femoral artery and localize the artery for access. Local anesthesia was provided with 1% lidocaine. Under ultrasound guidance, a 21 gauge needle was advanced into the artery and access secured with a micropuncture set. Over a guidewire, a 5 French sheath was placed.  A 5 [place] cobra catheter was advanced into the abdominal aorta. This is used to selectively catheterize the celiac axis. Selective arteriography was performed. The catheter was further advanced into the common hepatic artery. Selective arteriography was performed through the 5 French catheter positioned in the common hepatic artery.  A micro catheter was then introduced through the Cobra catheter and used to selectively catheterize the gastroduodenal artery. Selective arteriography was performed through the micro catheter. Transcatheter embolization of the gastroduodenal artery was then performed with advancement of interlock 0.018 inch embolization coils several coils were utilized ranging in diameter from as small as 3 mm up to 5 mm in diameter. After embolization, additional arteriography was performed at the level of the GDA origin as well as through the 5 French catheter positioned in the common hepatic artery.  A micro catheter was then advanced into the proper hepatic artery and further into the left hepatic artery. Selective arteriography was performed of the left hepatic artery. The catheter was retracted to the  level of the distal proper hepatic artery and technetium 99-m MAA injection performed.  After the procedure, catheters were removed. Oblique arteriography was performed at the level of right groin access. Arteriotomy closure was performed with the Cordis ExoSeal device.  COMPLICATIONS: None.  FINDINGS: Celiac arteriography shows a widely patent celiac axis supplying typical anatomy with patent left gastric artery  identified off of the celiac trunk and normally patent common hepatic and splenic arteries present. The gastroduodenal artery is patent and supplies the gastroepiploic artery as well as multiple pancreaticoduodenal branches. The proper hepatic artery splits into left and right hepatic arteries. No right gastric artery was identified. The cystic artery was visualized.  Right and left hepatic arteries supply multiple irregular distal branches which visibly supply metastatic lesions. The metastatic lesions demonstrate peripheral hypervascular enhancement. No visible shunting or arterial pseudoaneurysms identified. The left hepatic arterial distribution is fairly broad, consistent with the enlarged left hepatic lobe present by CT.  Embolization of the gastroduodenal artery was successful in occluding the artery. During embolization, some of the coil mass also enter the proximal origin of a prominent pancreaticoduodenal branch resulting in complete occlusion. There was a tiny additional pancreaticoduodenal branch identified emanating off of the distal common hepatic artery. This vessel could not be selectively catheterized.  Arteriotomy closure was successful. The patient was transported to nuclear medicine for additional imaging of the liver as well as calculation of any shunting of injected MAA activity to the lungs.  IMPRESSION: Arteriography demonstrates hypervascular tumor supply in both right and left lobes of the liver. Prior to planned radioembolization treatment, the gastroduodenal artery was successfully embolized with coils. A tiny pancreaticoduodenal branch off of the common hepatic artery could not be selectively catheterized.  PLAN: Yttrium 90 radioembolization is scheduled for 07/28/2014. Planned will be to perform right lobe treatment at that time followed by left lobe treatment in 4 weeks.   Electronically Signed   By: Aletta Edouard M.D.   On: 07/17/2014 14:29   Ir Angiogram Selective Each Additional  Vessel  07/17/2014   CLINICAL DATA:  Pancreatic carcinoma with metastatic disease to the liver. The patient presents for arteriographic mapping, embolization and nuclear medicine imaging prior to planned radioembolization with Yttrium 90 microspheres.  INDICATION: Radioembolization of liver metastases  EXAM: 1. ULTRASOUND GUIDANCE FOR VASCULAR ACCESS OF THE RIGHT COMMON FEMORAL ARTERY 2. SELECTIVE ARTERIOGRAPHY OF THE CELIAC AXIS 3. SELECTIVE ARTERIOGRAPHY OF THE COMMON HEPATIC ARTERY 4. SELECTIVE ARTERIOGRAPHY OF THE LEFT HEPATIC ARTERY 5. SELECTIVE ARTERIOGRAPHY OF THE GASTRODUODENAL ARTERY 6. TRANSCATHETER EMBOLIZATION OF THE GASTRODUODENAL ARTERY 7. FOLLOWUP ARTERIOGRAPHY AFTER EMBOLIZATION  ANESTHESIA/SEDATION: Sedation:  8.0 mg IV Versed sec, 200 mcg IV fentanyl.  Total Moderate Sedation Time:  106 minutes.  MEDICATIONS: No additional medications.  RADIOPHARMACEUTICALS:  5 millicuries technetium 27-P MAA injected into the proper hepatic artery.  CONTRAST:  80 mL Omnipaque 300  FLUOROSCOPY TIME:  21 min and 6 seconds.  ACCESS: Right common femoral artery.  PROCEDURE: Informed consent was obtained from the patient prior to the procedure. Maximal barrier sterile technique was utilized including caps, mask, sterile gowns, sterile gloves, sterile drape, hand hygiene and skin antiseptic. A time-out procedure was performed prior to the procedure.  The right groin was prepped with chlorhexidine. Ultrasound was used to confirm patency of the right common femoral artery and localize the artery for access. Local anesthesia was provided with 1% lidocaine. Under ultrasound guidance, a 21 gauge needle was advanced into the artery and access secured with a micropuncture set.  Over a guidewire, a 5 French sheath was placed.  A 5 [place] cobra catheter was advanced into the abdominal aorta. This is used to selectively catheterize the celiac axis. Selective arteriography was performed. The catheter was further advanced into the  common hepatic artery. Selective arteriography was performed through the 5 French catheter positioned in the common hepatic artery.  A micro catheter was then introduced through the Cobra catheter and used to selectively catheterize the gastroduodenal artery. Selective arteriography was performed through the micro catheter. Transcatheter embolization of the gastroduodenal artery was then performed with advancement of interlock 0.018 inch embolization coils several coils were utilized ranging in diameter from as small as 3 mm up to 5 mm in diameter. After embolization, additional arteriography was performed at the level of the GDA origin as well as through the 5 French catheter positioned in the common hepatic artery.  A micro catheter was then advanced into the proper hepatic artery and further into the left hepatic artery. Selective arteriography was performed of the left hepatic artery. The catheter was retracted to the level of the distal proper hepatic artery and technetium 99-m MAA injection performed.  After the procedure, catheters were removed. Oblique arteriography was performed at the level of right groin access. Arteriotomy closure was performed with the Cordis ExoSeal device.  COMPLICATIONS: None.  FINDINGS: Celiac arteriography shows a widely patent celiac axis supplying typical anatomy with patent left gastric artery identified off of the celiac trunk and normally patent common hepatic and splenic arteries present. The gastroduodenal artery is patent and supplies the gastroepiploic artery as well as multiple pancreaticoduodenal branches. The proper hepatic artery splits into left and right hepatic arteries. No right gastric artery was identified. The cystic artery was visualized.  Right and left hepatic arteries supply multiple irregular distal branches which visibly supply metastatic lesions. The metastatic lesions demonstrate peripheral hypervascular enhancement. No visible shunting or arterial  pseudoaneurysms identified. The left hepatic arterial distribution is fairly broad, consistent with the enlarged left hepatic lobe present by CT.  Embolization of the gastroduodenal artery was successful in occluding the artery. During embolization, some of the coil mass also enter the proximal origin of a prominent pancreaticoduodenal branch resulting in complete occlusion. There was a tiny additional pancreaticoduodenal branch identified emanating off of the distal common hepatic artery. This vessel could not be selectively catheterized.  Arteriotomy closure was successful. The patient was transported to nuclear medicine for additional imaging of the liver as well as calculation of any shunting of injected MAA activity to the lungs.  IMPRESSION: Arteriography demonstrates hypervascular tumor supply in both right and left lobes of the liver. Prior to planned radioembolization treatment, the gastroduodenal artery was successfully embolized with coils. A tiny pancreaticoduodenal branch off of the common hepatic artery could not be selectively catheterized.  PLAN: Yttrium 90 radioembolization is scheduled for 07/28/2014. Planned will be to perform right lobe treatment at that time followed by left lobe treatment in 4 weeks.   Electronically Signed   By: Aletta Edouard M.D.   On: 07/17/2014 14:29   Ir US Guide Vasc Access Right  07/17/2014   CLINICAL DATA:  Pancreatic carcinoma with metastatic disease to the liver. The patient presents for arteriographic mapping, embolization and nuclear medicine imaging prior to planned radioembolization with Yttrium 90 microspheres.  INDICATION: Radioembolization of liver metastases  EXAM: 1. ULTRASOUND GUIDANCE FOR VASCULAR ACCESS OF THE RIGHT COMMON FEMORAL ARTERY 2. SELECTIVE ARTERIOGRAPHY OF THE CELIAC AXIS 3. SELECTIVE ARTERIOGRAPHY OF THE COMMON HEPATIC ARTERY  4. SELECTIVE ARTERIOGRAPHY OF THE LEFT HEPATIC ARTERY 5. SELECTIVE ARTERIOGRAPHY OF THE GASTRODUODENAL ARTERY 6.  TRANSCATHETER EMBOLIZATION OF THE GASTRODUODENAL ARTERY 7. FOLLOWUP ARTERIOGRAPHY AFTER EMBOLIZATION  ANESTHESIA/SEDATION: Sedation:  8.0 mg IV Versed sec, 200 mcg IV fentanyl.  Total Moderate Sedation Time:  106 minutes.  MEDICATIONS: No additional medications.  RADIOPHARMACEUTICALS:  5 millicuries technetium 16-X MAA injected into the proper hepatic artery.  CONTRAST:  80 mL Omnipaque 300  FLUOROSCOPY TIME:  21 min and 6 seconds.  ACCESS: Right common femoral artery.  PROCEDURE: Informed consent was obtained from the patient prior to the procedure. Maximal barrier sterile technique was utilized including caps, mask, sterile gowns, sterile gloves, sterile drape, hand hygiene and skin antiseptic. A time-out procedure was performed prior to the procedure.  The right groin was prepped with chlorhexidine. Ultrasound was used to confirm patency of the right common femoral artery and localize the artery for access. Local anesthesia was provided with 1% lidocaine. Under ultrasound guidance, a 21 gauge needle was advanced into the artery and access secured with a micropuncture set. Over a guidewire, a 5 French sheath was placed.  A 5 [place] cobra catheter was advanced into the abdominal aorta. This is used to selectively catheterize the celiac axis. Selective arteriography was performed. The catheter was further advanced into the common hepatic artery. Selective arteriography was performed through the 5 French catheter positioned in the common hepatic artery.  A micro catheter was then introduced through the Cobra catheter and used to selectively catheterize the gastroduodenal artery. Selective arteriography was performed through the micro catheter. Transcatheter embolization of the gastroduodenal artery was then performed with advancement of interlock 0.018 inch embolization coils several coils were utilized ranging in diameter from as small as 3 mm up to 5 mm in diameter. After embolization, additional arteriography was  performed at the level of the GDA origin as well as through the 5 French catheter positioned in the common hepatic artery.  A micro catheter was then advanced into the proper hepatic artery and further into the left hepatic artery. Selective arteriography was performed of the left hepatic artery. The catheter was retracted to the level of the distal proper hepatic artery and technetium 99-m MAA injection performed.  After the procedure, catheters were removed. Oblique arteriography was performed at the level of right groin access. Arteriotomy closure was performed with the Cordis ExoSeal device.  COMPLICATIONS: None.  FINDINGS: Celiac arteriography shows a widely patent celiac axis supplying typical anatomy with patent left gastric artery identified off of the celiac trunk and normally patent common hepatic and splenic arteries present. The gastroduodenal artery is patent and supplies the gastroepiploic artery as well as multiple pancreaticoduodenal branches. The proper hepatic artery splits into left and right hepatic arteries. No right gastric artery was identified. The cystic artery was visualized.  Right and left hepatic arteries supply multiple irregular distal branches which visibly supply metastatic lesions. The metastatic lesions demonstrate peripheral hypervascular enhancement. No visible shunting or arterial pseudoaneurysms identified. The left hepatic arterial distribution is fairly broad, consistent with the enlarged left hepatic lobe present by CT.  Embolization of the gastroduodenal artery was successful in occluding the artery. During embolization, some of the coil mass also enter the proximal origin of a prominent pancreaticoduodenal branch resulting in complete occlusion. There was a tiny additional pancreaticoduodenal branch identified emanating off of the distal common hepatic artery. This vessel could not be selectively catheterized.  Arteriotomy closure was successful. The patient was  transported to nuclear medicine for  additional imaging of the liver as well as calculation of any shunting of injected MAA activity to the lungs.  IMPRESSION: Arteriography demonstrates hypervascular tumor supply in both right and left lobes of the liver. Prior to planned radioembolization treatment, the gastroduodenal artery was successfully embolized with coils. A tiny pancreaticoduodenal branch off of the common hepatic artery could not be selectively catheterized.  PLAN: Yttrium 90 radioembolization is scheduled for 07/28/2014. Planned will be to perform right lobe treatment at that time followed by left lobe treatment in 4 weeks.   Electronically Signed   By: Aletta Edouard M.D.   On: 07/17/2014 14:29   Ir Embo Tumor Organ Ischemia Infarct Inc Guide Roadmapping  07/17/2014   CLINICAL DATA:  Pancreatic carcinoma with metastatic disease to the liver. The patient presents for arteriographic mapping, embolization and nuclear medicine imaging prior to planned radioembolization with Yttrium 90 microspheres.  INDICATION: Radioembolization of liver metastases  EXAM: 1. ULTRASOUND GUIDANCE FOR VASCULAR ACCESS OF THE RIGHT COMMON FEMORAL ARTERY 2. SELECTIVE ARTERIOGRAPHY OF THE CELIAC AXIS 3. SELECTIVE ARTERIOGRAPHY OF THE COMMON HEPATIC ARTERY 4. SELECTIVE ARTERIOGRAPHY OF THE LEFT HEPATIC ARTERY 5. SELECTIVE ARTERIOGRAPHY OF THE GASTRODUODENAL ARTERY 6. TRANSCATHETER EMBOLIZATION OF THE GASTRODUODENAL ARTERY 7. FOLLOWUP ARTERIOGRAPHY AFTER EMBOLIZATION  ANESTHESIA/SEDATION: Sedation:  8.0 mg IV Versed sec, 200 mcg IV fentanyl.  Total Moderate Sedation Time:  106 minutes.  MEDICATIONS: No additional medications.  RADIOPHARMACEUTICALS:  5 millicuries technetium 63-K MAA injected into the proper hepatic artery.  CONTRAST:  80 mL Omnipaque 300  FLUOROSCOPY TIME:  21 min and 6 seconds.  ACCESS: Right common femoral artery.  PROCEDURE: Informed consent was obtained from the patient prior to the procedure. Maximal barrier  sterile technique was utilized including caps, mask, sterile gowns, sterile gloves, sterile drape, hand hygiene and skin antiseptic. A time-out procedure was performed prior to the procedure.  The right groin was prepped with chlorhexidine. Ultrasound was used to confirm patency of the right common femoral artery and localize the artery for access. Local anesthesia was provided with 1% lidocaine. Under ultrasound guidance, a 21 gauge needle was advanced into the artery and access secured with a micropuncture set. Over a guidewire, a 5 French sheath was placed.  A 5 [place] cobra catheter was advanced into the abdominal aorta. This is used to selectively catheterize the celiac axis. Selective arteriography was performed. The catheter was further advanced into the common hepatic artery. Selective arteriography was performed through the 5 French catheter positioned in the common hepatic artery.  A micro catheter was then introduced through the Cobra catheter and used to selectively catheterize the gastroduodenal artery. Selective arteriography was performed through the micro catheter. Transcatheter embolization of the gastroduodenal artery was then performed with advancement of interlock 0.018 inch embolization coils several coils were utilized ranging in diameter from as small as 3 mm up to 5 mm in diameter. After embolization, additional arteriography was performed at the level of the GDA origin as well as through the 5 French catheter positioned in the common hepatic artery.  A micro catheter was then advanced into the proper hepatic artery and further into the left hepatic artery. Selective arteriography was performed of the left hepatic artery. The catheter was retracted to the level of the distal proper hepatic artery and technetium 99-m MAA injection performed.  After the procedure, catheters were removed. Oblique arteriography was performed at the level of right groin access. Arteriotomy closure was performed  with the Cordis ExoSeal device.  COMPLICATIONS: None.  FINDINGS:  Celiac arteriography shows a widely patent celiac axis supplying typical anatomy with patent left gastric artery identified off of the celiac trunk and normally patent common hepatic and splenic arteries present. The gastroduodenal artery is patent and supplies the gastroepiploic artery as well as multiple pancreaticoduodenal branches. The proper hepatic artery splits into left and right hepatic arteries. No right gastric artery was identified. The cystic artery was visualized.  Right and left hepatic arteries supply multiple irregular distal branches which visibly supply metastatic lesions. The metastatic lesions demonstrate peripheral hypervascular enhancement. No visible shunting or arterial pseudoaneurysms identified. The left hepatic arterial distribution is fairly broad, consistent with the enlarged left hepatic lobe present by CT.  Embolization of the gastroduodenal artery was successful in occluding the artery. During embolization, some of the coil mass also enter the proximal origin of a prominent pancreaticoduodenal branch resulting in complete occlusion. There was a tiny additional pancreaticoduodenal branch identified emanating off of the distal common hepatic artery. This vessel could not be selectively catheterized.  Arteriotomy closure was successful. The patient was transported to nuclear medicine for additional imaging of the liver as well as calculation of any shunting of injected MAA activity to the lungs.  IMPRESSION: Arteriography demonstrates hypervascular tumor supply in both right and left lobes of the liver. Prior to planned radioembolization treatment, the gastroduodenal artery was successfully embolized with coils. A tiny pancreaticoduodenal branch off of the common hepatic artery could not be selectively catheterized.  PLAN: Yttrium 90 radioembolization is scheduled for 07/28/2014. Planned will be to perform right lobe  treatment at that time followed by left lobe treatment in 4 weeks.   Electronically Signed   By: Aletta Edouard M.D.   On: 07/17/2014 14:29   Nm Fusion  07/17/2014   CLINICAL DATA:  History of pancreatic cancer with unresectable liver metastasis.  EXAM: NUCLEAR MEDICINE LIVER SCAN; ULTRASOUND MISCELLANEOUS SOFT TISSUE  TECHNIQUE: Abdominal images were obtained in multiple projections after intrahepatic arterial injection of radiopharmaceutical. SPECT imaging was performed. Lung shunt calculation was performed.  RADIOPHARMACEUTICALS:  4.3MILLI CURIE MAA TECHNETIUM TO 40M ALBUMIN AGGREGATED  COMPARISON:  CT from 04/29/2014.  FINDINGS: The injected microaggregated albumin localizes within the liver. No evidence of activity within the stomach, duodenum, or bowel.  Calculated shunt fraction to the lungs equals 5.7%.  IMPRESSION: 1. No significant extrahepatic radiotracer activity following intrahepatic arterial injection of MAA. 2. Lung shunt fraction equals 5.7%   Electronically Signed   By: Kerby Moors M.D.   On: 07/17/2014 14:03    Labs:  CBC:  Recent Labs  03/06/14 0330 07/17/14 0750 07/28/14 0700  WBC 4.2 3.7* 5.6  HGB 12.4 10.7* 11.0*  HCT 37.5 33.0* 34.0*  PLT 194 164 284    COAGS:  Recent Labs  03/06/14 0330 07/17/14 0750 07/28/14 0700  INR 1.00 0.98 0.99  APTT  --  27  --     BMP:  Recent Labs  03/06/14 0330 07/17/14 0750 07/28/14 0700  NA 137 142 141  K 3.9 4.1 4.2  CL 97 105 101  CO2 25 25 27   GLUCOSE 106* 116* 108*  BUN 15 8 13   CALCIUM 9.2 9.0 9.4  CREATININE 0.56 0.57 0.66  GFRNONAA >90 >90 >90  GFRAA >90 >90 >90    LIVER FUNCTION TESTS:  Recent Labs  03/06/14 0330 07/17/14 0750 07/28/14 0700  BILITOT 0.7 0.7 0.6  AST 145* 101* 64*  ALT 206* 151* 57*  ALKPHOS 217* 440* 379*  PROT 6.1 6.8 6.9  ALBUMIN  3.0* 3.4* 3.4*    TUMOR MARKERS: No results for input(s): AFPTM, CEA, CA199, CHROMGRNA in the last 8760 hours.  Assessment and  Plan: Barbara Fuller is a 63 y.o. female with history of stage IV metastatic pancreatic adenocarcinoma and recent CT showing slight progression of hepatic metastatic disease despite chemotherapy. She presents today following IR consultation for hepatic Y90 radioembolization. Details/risks of procedure d/w pt /husband with their understanding and consent.         Signed: Autumn Messing 07/28/2014, 8:14 AM

## 2014-07-28 NOTE — Sedation Documentation (Signed)
Pts neck and torso flushed red, c/o back pain and being hot.  Will give IV Benadryl and Fentanyl as ordered by Dr. Kathlene Cote.  Will monitor pt closely.

## 2014-07-28 NOTE — Sedation Documentation (Signed)
5Fr sheath removed from R femoral artery by Dr. Kathlene Cote.  Hemostasis achieved using Exoseal device.  Groin level 0, 4+ RDP.

## 2014-07-28 NOTE — Sedation Documentation (Signed)
Pt transported to Short Stay for recovery on bed with RN.

## 2014-07-28 NOTE — Procedures (Signed)
Procedure:  Hepatic angiography, cystic artery embolization and radioembolization of right lobe of liver Access:  Right CFA, 5 Fr sheath Findings:  Prominent flow in cystic artery located near bifurcation of right hepatic artery.  Cystic artery embolized with 2 separate 2 mm Interlock Soft coils.  SIR sphere Y-90 radioembolization performed of right lobe of liver via right hepatic artery with administration 29 mCi dose. NM imaging pending. Plan:  4 hour recovery.  Patient scheduled for left lobe radioembolization on 08/24/14.

## 2014-08-11 NOTE — Progress Notes (Signed)
Patient ID: Barbara Fuller, female   DOB: March 27, 1951, 63 y.o.   MRN: [id number] Pt called to report some intermittent nausea following recent Y-90 hepatic radioembolization. Denies fever, chills, vomiting, breathing difficulties or sig abd pain. Has used zofran with mild decrease in nausea. Case d/w Dr. Kathlene Cote and prescription called in for phenergan 25 mg #20 (1 refill)- 1 tablet every 6 hrs prn nausea. Pt scheduled for f/u Y-90 treatment of left hepatic lobe on 08/24/14.

## 2014-08-20 ENCOUNTER — Other Ambulatory Visit: Payer: Self-pay | Admitting: Radiology

## 2014-08-20 ENCOUNTER — Encounter (HOSPITAL_COMMUNITY): Payer: Self-pay | Admitting: Cardiology

## 2014-08-24 ENCOUNTER — Encounter (HOSPITAL_COMMUNITY)
Admission: RE | Admit: 2014-08-24 | Discharge: 2014-08-24 | Disposition: A | Discharge status: Home / Self Care | Payer: BC Managed Care – PPO | Source: Ambulatory Visit | Attending: Interventional Radiology | Admitting: Interventional Radiology

## 2014-08-24 ENCOUNTER — Encounter (HOSPITAL_COMMUNITY): Payer: Self-pay

## 2014-08-24 ENCOUNTER — Ambulatory Visit (HOSPITAL_COMMUNITY)
Admission: RE | Admit: 2014-08-24 | Discharge: 2014-08-24 | Disposition: A | Discharge status: Home / Self Care | Payer: BC Managed Care – PPO | Source: Ambulatory Visit | Attending: Interventional Radiology | Admitting: Interventional Radiology

## 2014-08-24 ENCOUNTER — Other Ambulatory Visit: Payer: Self-pay | Admitting: Interventional Radiology

## 2014-08-24 DIAGNOSIS — C259 Malignant neoplasm of pancreas, unspecified: Secondary | ICD-10-CM

## 2014-08-24 DIAGNOSIS — Z7982 Long term (current) use of aspirin: Secondary | ICD-10-CM | POA: Insufficient documentation

## 2014-08-24 DIAGNOSIS — C787 Secondary malignant neoplasm of liver and intrahepatic bile duct: Secondary | ICD-10-CM | POA: Diagnosis present

## 2014-08-24 DIAGNOSIS — Z79891 Long term (current) use of opiate analgesic: Secondary | ICD-10-CM | POA: Insufficient documentation

## 2014-08-24 DIAGNOSIS — Z79899 Other long term (current) drug therapy: Secondary | ICD-10-CM | POA: Diagnosis not present

## 2014-08-24 LAB — COMPREHENSIVE METABOLIC PANEL
ALT: 79 U/L — ABNORMAL HIGH (ref 0–35)
AST: 91 U/L — ABNORMAL HIGH (ref 0–37)
Albumin: 3.1 g/dL — ABNORMAL LOW (ref 3.5–5.2)
Alkaline Phosphatase: 425 U/L — ABNORMAL HIGH (ref 39–117)
Anion gap: 13 (ref 5–15)
BUN: 7 mg/dL (ref 6–23)
CALCIUM: 9.2 mg/dL (ref 8.4–10.5)
CO2: 25 mEq/L (ref 19–32)
Chloride: 98 mEq/L (ref 96–112)
Creatinine, Ser: 0.57 mg/dL (ref 0.50–1.10)
GFR calc non Af Amer: >90 mL/min (ref >90)
GLUCOSE: 113 mg/dL — AB (ref 70–99)
Potassium: 4.1 mEq/L (ref 3.7–5.3)
SODIUM: 136 meq/L — AB (ref 137–147)
TOTAL PROTEIN: 6.7 g/dL (ref 6.0–8.3)
Total Bilirubin: 1.4 mg/dL — ABNORMAL HIGH (ref 0.3–1.2)

## 2014-08-24 LAB — CBC WITH DIFFERENTIAL/PLATELET
BASOS PCT: 1 % (ref 0–1)
Basophils Absolute: 0 10*3/uL (ref 0.0–0.1)
EOS ABS: 0.2 10*3/uL (ref 0.0–0.7)
EOS PCT: 3 % (ref 0–5)
HCT: 35.5 % — ABNORMAL LOW (ref 36.0–46.0)
Hemoglobin: 11.3 g/dL — ABNORMAL LOW (ref 12.0–15.0)
Lymphocytes Relative: 7 % — ABNORMAL LOW (ref 12–46)
Lymphs Abs: 0.4 10*3/uL — ABNORMAL LOW (ref 0.7–4.0)
MCH: 30.1 pg (ref 26.0–34.0)
MCHC: 31.8 g/dL (ref 30.0–36.0)
MCV: 94.7 fL (ref 78.0–100.0)
Monocytes Absolute: 1 10*3/uL (ref 0.1–1.0)
Monocytes Relative: 16 % — ABNORMAL HIGH (ref 3–12)
Neutro Abs: 4.3 10*3/uL (ref 1.7–7.7)
Neutrophils Relative %: 73 % (ref 43–77)
PLATELETS: 201 10*3/uL (ref 150–400)
RBC: 3.75 MIL/uL — AB (ref 3.87–5.11)
RDW: 15.2 % (ref 11.5–15.5)
WBC: 5.8 10*3/uL (ref 4.0–10.5)

## 2014-08-24 LAB — PROTIME-INR
INR: 0.97 (ref 0.00–1.49)
Prothrombin Time: 12.9 seconds (ref 11.6–15.2)

## 2014-08-24 MED ORDER — PANTOPRAZOLE SODIUM 40 MG IV SOLR
40.0000 mg | Freq: Once | INTRAVENOUS | Status: AC
  Administered 2014-08-24: 40 mg via INTRAVENOUS
  Filled 2014-08-24: qty 40

## 2014-08-24 MED ORDER — CIPROFLOXACIN IN D5W 400 MG/200ML IV SOLN
INTRAVENOUS | Status: AC
  Filled 2014-08-24: qty 200

## 2014-08-24 MED ORDER — DIPHENHYDRAMINE HCL 50 MG/ML IJ SOLN
INTRAMUSCULAR | Status: AC
  Filled 2014-08-24: qty 1

## 2014-08-24 MED ORDER — SODIUM CHLORIDE 0.9 % IJ SOLN
10.0000 mL | Freq: Once | INTRAMUSCULAR | Status: AC
  Administered 2014-08-24: 10 mL

## 2014-08-24 MED ORDER — HYDROMORPHONE HCL 1 MG/ML IJ SOLN
INTRAMUSCULAR | Status: AC | PRN
  Administered 2014-08-24: 1 mg via INTRAVENOUS

## 2014-08-24 MED ORDER — HYDROMORPHONE HCL 2 MG/ML IJ SOLN: 1.0000 mg | INTRAMUSCULAR | Status: DC | PRN

## 2014-08-24 MED ORDER — PANTOPRAZOLE SODIUM 40 MG IV SOLR
INTRAVENOUS | Status: AC
  Filled 2014-08-24: qty 40

## 2014-08-24 MED ORDER — SODIUM CHLORIDE 0.9 % IV SOLN: INTRAVENOUS | Status: DC

## 2014-08-24 MED ORDER — PROMETHAZINE HCL 25 MG/ML IJ SOLN: 12.5000 mg | Freq: Once | INTRAMUSCULAR | Status: DC

## 2014-08-24 MED ORDER — FENTANYL CITRATE 0.05 MG/ML IJ SOLN
INTRAMUSCULAR | Status: AC | PRN
  Administered 2014-08-24 (×3): 100 ug via INTRAVENOUS

## 2014-08-24 MED ORDER — ONDANSETRON 8 MG/NS 50 ML IVPB
8.0000 mg | Freq: Once | INTRAVENOUS | Status: DC
  Administered 2014-08-24: 8 mg via INTRAVENOUS
  Filled 2014-08-24: qty 8

## 2014-08-24 MED ORDER — FENTANYL CITRATE 0.05 MG/ML IJ SOLN
INTRAMUSCULAR | Status: AC
  Filled 2014-08-24: qty 8

## 2014-08-24 MED ORDER — HYDROMORPHONE HCL 2 MG/ML IJ SOLN
INTRAMUSCULAR | Status: AC
  Filled 2014-08-24: qty 1

## 2014-08-24 MED ORDER — MIDAZOLAM HCL 2 MG/2ML IJ SOLN
INTRAMUSCULAR | Status: AC | PRN
  Administered 2014-08-24: 2 mg via INTRAVENOUS
  Administered 2014-08-24: 1 mg via INTRAVENOUS
  Administered 2014-08-24: 2 mg via INTRAVENOUS

## 2014-08-24 MED ORDER — DIPHENHYDRAMINE HCL 50 MG/ML IJ SOLN
25.0000 mg | Freq: Once | INTRAMUSCULAR | Status: AC
  Administered 2014-08-24: 25 mg via INTRAVENOUS

## 2014-08-24 MED ORDER — CIPROFLOXACIN IN D5W 400 MG/200ML IV SOLN
400.0000 mg | Freq: Once | INTRAVENOUS | Status: AC
  Administered 2014-08-24: 400 mg via INTRAVENOUS

## 2014-08-24 MED ORDER — HEPARIN SOD (PORK) LOCK FLUSH 100 UNIT/ML IV SOLN
500.0000 [IU] | Freq: Once | INTRAVENOUS | Status: AC
  Administered 2014-08-24: 500 [IU] via INTRAVENOUS
  Filled 2014-08-24: qty 5

## 2014-08-24 MED ORDER — MIDAZOLAM HCL 2 MG/2ML IJ SOLN
INTRAMUSCULAR | Status: AC
  Filled 2014-08-24: qty 8

## 2014-08-24 MED ORDER — DEXAMETHASONE SODIUM PHOSPHATE 10 MG/ML IJ SOLN
INTRAMUSCULAR | Status: AC
  Filled 2014-08-24: qty 1

## 2014-08-24 MED ORDER — LIDOCAINE HCL 1 % IJ SOLN
INTRAMUSCULAR | Status: AC
  Filled 2014-08-24: qty 20

## 2014-08-24 MED ORDER — IOHEXOL 300 MG/ML  SOLN
100.0000 mL | Freq: Once | INTRAMUSCULAR | Status: AC | PRN
  Administered 2014-08-24: 100 mL

## 2014-08-24 MED ORDER — YTTRIUM 90 INJECTION: 17.8500 | INJECTION | Freq: Once | INTRAVENOUS | Status: DC

## 2014-08-24 MED ORDER — MIDAZOLAM HCL 2 MG/2ML IJ SOLN
INTRAMUSCULAR | Status: DC | PRN
  Administered 2014-08-24: 1 mg via INTRAVENOUS

## 2014-08-24 MED ORDER — SODIUM CHLORIDE 0.9 % IV SOLN
INTRAVENOUS | Status: DC
  Administered 2014-08-24: 08:00:00 via INTRAVENOUS

## 2014-08-24 MED ORDER — DEXAMETHASONE SODIUM PHOSPHATE 10 MG/ML IJ SOLN
10.0000 mg | Freq: Once | INTRAMUSCULAR | Status: AC
  Administered 2014-08-24: 10 mg via INTRAVENOUS
  Filled 2014-08-24: qty 1

## 2014-08-24 NOTE — H&P (Signed)
Chief Complaint: "I am here for my liver treatment."  Referring Physician(s): Yamagata,Glenn T  History of Present Illness: Barbara Fuller is a 63 y.o. female with metastatic pancreatic cancer s/p right hepatic Y-90 Radioembolization on 07/28/14 she tolerated the procedure well and did have some post procedure nausea and fatigue that has since improved. She is scheduled today for left hepatic lobe Y-90 radioembolization. She denies any chest pain, shortness of breath or palpitations. She denies any active signs of bleeding or excessive bruising. She denies any recent fever or chills. The patient denies any history of sleep apnea or chronic oxygen use. She has previously tolerated sedation without complications.   Past Medical History  Diagnosis Date  . Non-smoker   . Cancer     Metastatic pancreatic adenocarcinoma w/ liver metastses    . Anxiety   . History of blood clot in brain 2005    treated at [facility] unable to determine the cause and no signs of a stroke    Past Surgical History  Procedure Laterality Date  . Brain surgery      Pt denies this. Only had a clot on the brain that resolved with no residual  . Tubal ligation    . Tonsillectomy    . Portacath placement  02/2014  . Cardiac catheterization  03/06/2014    at [facility] / this was related to a heart spasm due to chemo  . Left heart cath N/A 03/06/2014    Procedure: LEFT HEART CATH;  Surgeon: Clent Demark, MD;  Location: [facility] CATH LAB;  Service: Cardiovascular;  Laterality: N/A;    Allergies: Amoxicillin; Cashew nut oil; Heparin; Influenza vaccines; Keflex; Other; Peanut-containing drug products; Penicillins; Red dye; and Tape  Medications: Prior to Admission medications   Medication Sig Start Date End Date Taking? Authorizing Provider  lidocaine-prilocaine (EMLA) cream Apply 1 application topically daily as needed (Chemo treatments).  03/03/14  Yes Historical Provider, MD  LORazepam (ATIVAN) 1 MG tablet Take 1 mg by  mouth every 8 (eight) hours as needed for anxiety (Nausea).  03/03/14  Yes Historical Provider, MD  morphine (MSIR) 15 MG tablet Take 15 mg by mouth at bedtime.   Yes Historical Provider, MD  naphazoline-glycerin (CLEAR EYES) 0.012-0.2 % SOLN Place 1-2 drops into both eyes every 4 (four) hours as needed for irritation.   Yes Historical Provider, MD  omeprazole (PRILOSEC) 20 MG capsule Take 20 mg by mouth every morning.   Yes Historical Provider, MD  ondansetron (ZOFRAN-ODT) 8 MG disintegrating tablet Take 8 mg by mouth every 8 (eight) hours as needed for nausea or vomiting.   Yes Historical Provider, MD  polyethylene glycol (MIRALAX / GLYCOLAX) packet Take 17 g by mouth every morning.    Yes Historical Provider, MD  potassium chloride SA (K-DUR,KLOR-CON) 20 MEQ tablet Take 10 mEq by mouth every morning.   Yes Historical Provider, MD  ALPRAZolam Duanne Moron) 0.5 MG tablet Take 0.25 mg by mouth at bedtime as needed for anxiety or sleep.    Historical Provider, MD  aspirin EC 81 MG EC tablet Take 1 tablet (81 mg total) by mouth daily. Patient not taking: Reported on 07/24/2014 03/07/14   Clent Demark, MD  calcium elemental as carbonate (PX ANTACID MAXIMUM STRENGTH) 400 MG tablet Chew 1,000 mg by mouth 2 (two) times daily as needed for heartburn.     Historical Provider, MD  diphenhydrAMINE (BENADRYL) 25 mg capsule Take 25 mg by mouth daily as needed for itching or allergies.  Historical Provider, MD    History reviewed. No pertinent family history.  History   Social History  . Marital Status: Married    Spouse Name: N/A    Number of Children: N/A  . Years of Education: N/A   Social History Main Topics  . Smoking status: Never Smoker   . Smokeless tobacco: None  . Alcohol Use: No  . Drug Use: No  . Sexual Activity: None   Other Topics Concern  . None   Social History Narrative    Review of Systems: A 12 point ROS discussed and pertinent positives are indicated in the HPI above.  All  other systems are negative.  Review of Systems  Vital Signs: BP 128/83 mmHg  Pulse 97  Temp(Src) 98.2 F (36.8 C) (Oral)  Resp 16  Ht 5' 3.5" (1.613 m)  Wt 137 lb (62.143 kg)  BMI 23.88 kg/m2  SpO2 97%  Physical Exam  Constitutional: She is oriented to person, place, and time. No distress.  HENT:  Head: Normocephalic and atraumatic.  Neck: No tracheal deviation present.  Cardiovascular: Normal rate and regular rhythm.  Exam reveals no gallop and no friction rub.   No murmur heard. Pulmonary/Chest: Effort normal and breath sounds normal. No respiratory distress. She has no wheezes. She has no rales.  Abdominal: Soft. Bowel sounds are normal. There is no tenderness.  Neurological: She is alert and oriented to person, place, and time.  Skin: She is not diaphoretic.  Right chest port intact    Imaging: Nm Liver Img Spect  07/28/2014   CLINICAL DATA:  Pancreatic adenocarcinoma unresectable liver metastasis. Post Yttrium 90 bremsstrahlung SPECT exam  EXAM: NUCLEAR MEDICINE LIVER SCAN SPECT  TECHNIQUE: Abdominal images were obtained in multiple projections after intravenous injection of radiopharmaceutical.  COMPARISON:  MAA scan 07/17/2014  FINDINGS: Bremsstrahlung planar and SPECT imaging of the abdomen following intrahepatic arterial delivery of Y-43microsphere demonstrates radioactivity localized to the right hepatic lobe. No evidence of extrahepatic activity. Specifically no evidence of gallbladder activity.  IMPRESSION: Bremssstrahlung scan demonstrates activity localized to right hepatic lobe with no extrahepatic activity identified.   Electronically Signed   By: Suzy Bouchard M.D.   On: 07/28/2014 14:53   Ir Angiogram Visceral Selective  07/28/2014   CLINICAL DATA:  Metastatic pancreatic carcinoma to the liver. Status post mapping arteriography and transcatheter embolization of the gastroduodenal artery on 07/17/2014 in preparation for Yttrium 90 radioembolization. The patient  now presents for radioembolization of the right lobe of the liver.  EXAM: 1. ULTRASOUND GUIDANCE FOR VASCULAR ACCESS OF THE RIGHT COMMON FEMORAL ARTERY 2. HEPATIC ARTERIOGRAPHY WITH SELECTIVE CATHETERIZATION OF THE CELIAC AXIS AND ARTERIOGRAPHY AT THE LEVEL OF THE COMMON HEPATIC ARTERY 3. TRANSCATHETER EMBOLIZATION OF CYSTIC ARTERY 4. FOLLOW-UP ANGIOGRAPHY AFTER CYSTIC ARTERY EMBOLIZATION 5. SELECTIVE ARTERIOGRAPHY OF THE RIGHT HEPATIC ARTERY 6. TRANSCATHETER YTTRIUM-90 RADIOEMBOLIZATION OF THE RIGHT HEPATIC ARTERY 7. FOLLOW-UP ANGIOGRAPHY AFTER RIGHT HEPATIC RADIOEMBOLIZATION  FLUOROSCOPY TIME:  10 min and 36 seconds.  MEDICATIONS AND MEDICAL HISTORY: Medications: 1 g IV vancomycin, 400 mg IV Cipro, 10 mg IV Decadron, 40 mg IV Protonix, 4 mg IV Zofran  Y-90 dose: 29 mCi  ANESTHESIA/SEDATION: 5.0 mg IV Versed; 150 mcg IV Fentanyl.  Moderate sedation time: 88 minutes  CONTRAST:  80 ml Omnipaque-300  PROCEDURE: The procedure, risks, benefits, and alternatives were explained to the patient. Questions regarding the procedure were encouraged and answered. The patient understands and consents to the procedure.  The right groin was prepped with Betadine  in a sterile fashion, and a sterile drape was applied covering the operative field. A sterile gown and sterile gloves were used for the procedure. Local anesthesia was provided with 1% Lidocaine. A time-out procedure was performed.  Ultrasound was used to confirm patency of the right common femoral artery. Access of the right common femoral artery was performed under ultrasound guidance with a micropuncture set. Ultrasound image documentation was performed. A 5-French sheath was placed. A 5-French Cobra catheter was advanced and used to selectively catheterize the celiac axis. The catheter was further advanced into the common hepatic artery and selective arteriography performed.  A microcatheter was advanced through the 5 French catheter and used to selectively catheterize  the cystic artery. Selective arteriography was performed. Embolization of the cystic artery was performed with placement of 2 separate 2 mm embolization coils. Additional arteriography was then performed.  A high flow microcatheter was then advanced into the right hepatic artery and selective arteriography performed. This catheter was positioned in the right hepatic artery for treatment.  Radioembolization of the right lobe of the liver was performed with Yttrium-90 SIR Spheres. Particles were administered via a microcatheter utilizing a completely enclosed system. Monitoring of antegrade flow was performed during administration under fluoroscopy with use of contrast intermittently. After administration of the first dose of particles, the microcatheter was removed and discarded along with the attached tubing and particle vial.  Femoral puncture site was assessed with oblique arteriography. Arteriotomy closure was performed with the Cordis ExoSeal device.  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 In planning for performing right hepatic radioembolization, review of the previous nuclear medicine imaging performed after an a injection in the right hepatic artery shows prominent activity at the level of the gallbladder. Arteriography shows a fairly prominent cystic artery emanating from the proximal right hepatic artery. The hepatic artery immediately bifurcates just beyond the cystic artery origin and it was felt that there would be an increased risk of radiation induced cholecystitis via right hepatic arterial treatment without partial embolization of the cystic artery.  Cystic arteriography shows a normal configuration of the cystic artery which bifurcates into 2 main branches supplying the gallbladder. After placing 2 mm coils in the main cystic artery, there was significant diminished flow noted in the cystic artery. Right hepatic  radioembolization was performed with a micro catheter positioned just beyond the cystic artery origin and just proximal to the bifurcation of the right hepatic artery.  Administration of Yttrium 90 SIR spheres was uncomplicated with antegrade flow maintained throughout particle administration.  Arteriotomy closure was initially successful in establishing hemostasis. The patient will recover for 6 hours after the procedure.  COMPLICATIONS: None  IMPRESSION: Hepatic arterial radioembolization performed with Yttrium-90 microspheres. A dose of 29 mCi was successfully delivered into the right hepatic artery. Prior to radioembolization, embolization of the cystic artery was performed to reduce the risk of radiation induced cholecystitis. The patient is scheduled for additional treatment of the left lobe of the liver on 08/24/2014.   Electronically Signed   By: Aletta Edouard M.D.   On: 07/28/2014 17:51   Ir Angiogram Selective Each Additional Vessel  07/28/2014   CLINICAL DATA:  Metastatic pancreatic carcinoma to the liver. Status post mapping arteriography and transcatheter embolization of the gastroduodenal artery on 07/17/2014 in preparation for Yttrium 90 radioembolization. The patient now presents for radioembolization of the right lobe of the liver.  EXAM: 1. ULTRASOUND GUIDANCE FOR VASCULAR ACCESS OF THE RIGHT COMMON  FEMORAL ARTERY 2. HEPATIC ARTERIOGRAPHY WITH SELECTIVE CATHETERIZATION OF THE CELIAC AXIS AND ARTERIOGRAPHY AT THE LEVEL OF THE COMMON HEPATIC ARTERY 3. TRANSCATHETER EMBOLIZATION OF CYSTIC ARTERY 4. FOLLOW-UP ANGIOGRAPHY AFTER CYSTIC ARTERY EMBOLIZATION 5. SELECTIVE ARTERIOGRAPHY OF THE RIGHT HEPATIC ARTERY 6. TRANSCATHETER YTTRIUM-90 RADIOEMBOLIZATION OF THE RIGHT HEPATIC ARTERY 7. FOLLOW-UP ANGIOGRAPHY AFTER RIGHT HEPATIC RADIOEMBOLIZATION  FLUOROSCOPY TIME:  10 min and 36 seconds.  MEDICATIONS AND MEDICAL HISTORY: Medications: 1 g IV vancomycin, 400 mg IV Cipro, 10 mg IV Decadron, 40 mg IV  Protonix, 4 mg IV Zofran  Y-90 dose: 29 mCi  ANESTHESIA/SEDATION: 5.0 mg IV Versed; 150 mcg IV Fentanyl.  Moderate sedation time: 88 minutes  CONTRAST:  80 ml Omnipaque-300  PROCEDURE: The procedure, risks, benefits, and alternatives were explained to the patient. Questions regarding the procedure were encouraged and answered. The patient understands and consents to the procedure.  The right groin was prepped with Betadine in a sterile fashion, and a sterile drape was applied covering the operative field. A sterile gown and sterile gloves were used for the procedure. Local anesthesia was provided with 1% Lidocaine. A time-out procedure was performed.  Ultrasound was used to confirm patency of the right common femoral artery. Access of the right common femoral artery was performed under ultrasound guidance with a micropuncture set. Ultrasound image documentation was performed. A 5-French sheath was placed. A 5-French Cobra catheter was advanced and used to selectively catheterize the celiac axis. The catheter was further advanced into the common hepatic artery and selective arteriography performed.  A microcatheter was advanced through the 5 French catheter and used to selectively catheterize the cystic artery. Selective arteriography was performed. Embolization of the cystic artery was performed with placement of 2 separate 2 mm embolization coils. Additional arteriography was then performed.  A high flow microcatheter was then advanced into the right hepatic artery and selective arteriography performed. This catheter was positioned in the right hepatic artery for treatment.  Radioembolization of the right lobe of the liver was performed with Yttrium-90 SIR Spheres. Particles were administered via a microcatheter utilizing a completely enclosed system. Monitoring of antegrade flow was performed during administration under fluoroscopy with use of contrast intermittently. After administration of the first dose of  particles, the microcatheter was removed and discarded along with the attached tubing and particle vial.  Femoral puncture site was assessed with oblique arteriography. Arteriotomy closure was performed with the Cordis ExoSeal device.  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 In planning for performing right hepatic radioembolization, review of the previous nuclear medicine imaging performed after an a injection in the right hepatic artery shows prominent activity at the level of the gallbladder. Arteriography shows a fairly prominent cystic artery emanating from the proximal right hepatic artery. The hepatic artery immediately bifurcates just beyond the cystic artery origin and it was felt that there would be an increased risk of radiation induced cholecystitis via right hepatic arterial treatment without partial embolization of the cystic artery.  Cystic arteriography shows a normal configuration of the cystic artery which bifurcates into 2 main branches supplying the gallbladder. After placing 2 mm coils in the main cystic artery, there was significant diminished flow noted in the cystic artery. Right hepatic radioembolization was performed with a micro catheter positioned just beyond the cystic artery origin and just proximal to the bifurcation of the right hepatic artery.  Administration of Yttrium 90 SIR spheres was uncomplicated with antegrade flow maintained throughout particle administration.  Arteriotomy closure was initially successful in establishing hemostasis. The patient will recover for 6 hours after the procedure.  COMPLICATIONS: None  IMPRESSION: Hepatic arterial radioembolization performed with Yttrium-90 microspheres. A dose of 29 mCi was successfully delivered into the right hepatic artery. Prior to radioembolization, embolization of the cystic artery was performed to reduce the risk of radiation induced cholecystitis.  The patient is scheduled for additional treatment of the left lobe of the liver on 08/24/2014.   Electronically Signed   By: Aletta Edouard M.D.   On: 07/28/2014 17:51   Ir Angiogram Selective Each Additional Vessel  07/28/2014   CLINICAL DATA:  Metastatic pancreatic carcinoma to the liver. Status post mapping arteriography and transcatheter embolization of the gastroduodenal artery on 07/17/2014 in preparation for Yttrium 90 radioembolization. The patient now presents for radioembolization of the right lobe of the liver.  EXAM: 1. ULTRASOUND GUIDANCE FOR VASCULAR ACCESS OF THE RIGHT COMMON FEMORAL ARTERY 2. HEPATIC ARTERIOGRAPHY WITH SELECTIVE CATHETERIZATION OF THE CELIAC AXIS AND ARTERIOGRAPHY AT THE LEVEL OF THE COMMON HEPATIC ARTERY 3. TRANSCATHETER EMBOLIZATION OF CYSTIC ARTERY 4. FOLLOW-UP ANGIOGRAPHY AFTER CYSTIC ARTERY EMBOLIZATION 5. SELECTIVE ARTERIOGRAPHY OF THE RIGHT HEPATIC ARTERY 6. TRANSCATHETER YTTRIUM-90 RADIOEMBOLIZATION OF THE RIGHT HEPATIC ARTERY 7. FOLLOW-UP ANGIOGRAPHY AFTER RIGHT HEPATIC RADIOEMBOLIZATION  FLUOROSCOPY TIME:  10 min and 36 seconds.  MEDICATIONS AND MEDICAL HISTORY: Medications: 1 g IV vancomycin, 400 mg IV Cipro, 10 mg IV Decadron, 40 mg IV Protonix, 4 mg IV Zofran  Y-90 dose: 29 mCi  ANESTHESIA/SEDATION: 5.0 mg IV Versed; 150 mcg IV Fentanyl.  Moderate sedation time: 88 minutes  CONTRAST:  80 ml Omnipaque-300  PROCEDURE: The procedure, risks, benefits, and alternatives were explained to the patient. Questions regarding the procedure were encouraged and answered. The patient understands and consents to the procedure.  The right groin was prepped with Betadine in a sterile fashion, and a sterile drape was applied covering the operative field. A sterile gown and sterile gloves were used for the procedure. Local anesthesia was provided with 1% Lidocaine. A time-out procedure was performed.  Ultrasound was used to confirm patency of the right common femoral artery. Access of  the right common femoral artery was performed under ultrasound guidance with a micropuncture set. Ultrasound image documentation was performed. A 5-French sheath was placed. A 5-French Cobra catheter was advanced and used to selectively catheterize the celiac axis. The catheter was further advanced into the common hepatic artery and selective arteriography performed.  A microcatheter was advanced through the 5 French catheter and used to selectively catheterize the cystic artery. Selective arteriography was performed. Embolization of the cystic artery was performed with placement of 2 separate 2 mm embolization coils. Additional arteriography was then performed.  A high flow microcatheter was then advanced into the right hepatic artery and selective arteriography performed. This catheter was positioned in the right hepatic artery for treatment.  Radioembolization of the right lobe of the liver was performed with Yttrium-90 SIR Spheres. Particles were administered via a microcatheter utilizing a completely enclosed system. Monitoring of antegrade flow was performed during administration under fluoroscopy with use of contrast intermittently. After administration of the first dose of particles, the microcatheter was removed and discarded along with the attached tubing and particle vial.  Femoral puncture site was assessed with oblique arteriography. Arteriotomy closure was performed with the Cordis ExoSeal device.  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 In planning for performing right hepatic radioembolization, review of the previous  nuclear medicine imaging performed after an a injection in the right hepatic artery shows prominent activity at the level of the gallbladder. Arteriography shows a fairly prominent cystic artery emanating from the proximal right hepatic artery. The hepatic artery immediately bifurcates just beyond the cystic  artery origin and it was felt that there would be an increased risk of radiation induced cholecystitis via right hepatic arterial treatment without partial embolization of the cystic artery.  Cystic arteriography shows a normal configuration of the cystic artery which bifurcates into 2 main branches supplying the gallbladder. After placing 2 mm coils in the main cystic artery, there was significant diminished flow noted in the cystic artery. Right hepatic radioembolization was performed with a micro catheter positioned just beyond the cystic artery origin and just proximal to the bifurcation of the right hepatic artery.  Administration of Yttrium 90 SIR spheres was uncomplicated with antegrade flow maintained throughout particle administration.  Arteriotomy closure was initially successful in establishing hemostasis. The patient will recover for 6 hours after the procedure.  COMPLICATIONS: None  IMPRESSION: Hepatic arterial radioembolization performed with Yttrium-90 microspheres. A dose of 29 mCi was successfully delivered into the right hepatic artery. Prior to radioembolization, embolization of the cystic artery was performed to reduce the risk of radiation induced cholecystitis. The patient is scheduled for additional treatment of the left lobe of the liver on 08/24/2014.   Electronically Signed   By: Aletta Edouard M.D.   On: 07/28/2014 17:51   Nm Special Med Rad Physics Cons  07/28/2014   CLINICAL DATA:  Pancreatic cancer with unresectable liver metastasis. Yttrium 90 microsphere radio embolization.  EXAM: NUCLEAR MEDICINE TREATMENT PROCEDURE; NUCLEAR MEDICINE SPECIAL MED RAD PHYSICS CONS; NUCLEAR MEDICINE RADIO PHARM THERAPY INTRA ARTERIAL  TECHNIQUE: In conjunction with the interventional radiologist a Y- Microsphere dose was calculated utilizing body surface area formulation. Calculated dose equal 26.9 mCi. Pre therapy MAA liver SPECT scan and CTA were evaluated. Utilizing a microcatheter system, the  hepatic artery was selected and Y-90 microspheres were delivered in fractionated aliquots. Radiopharmaceutical was delivered by the interventional radiologist and nuclear radiologist.  The patient tolerated procedure well. No adverse effects were noted. Bremsstrahlung scan to follow.  RADIOPHARMACEUTICALS:  26.9 mCi Yttrium 90 microspheres  COMPARISON:  MAA scan 07/17/2014, CT 04/29/2014  FINDINGS: Y - 90 microspheres therapy as above. First therapy the right hepatic lobe.  IMPRESSION: Successful Y - 90 microsphere delivery for treatment of unresectable liver metastasis. First therapy to the right hepatic lobe.   Electronically Signed   By: Suzy Bouchard M.D.   On: 07/28/2014 14:48   Nm Special Treatment Procedure  07/28/2014   CLINICAL DATA:  Pancreatic cancer with unresectable liver metastasis. Yttrium 90 microsphere radio embolization.  EXAM: NUCLEAR MEDICINE TREATMENT PROCEDURE; NUCLEAR MEDICINE SPECIAL MED RAD PHYSICS CONS; NUCLEAR MEDICINE RADIO PHARM THERAPY INTRA ARTERIAL  TECHNIQUE: In conjunction with the interventional radiologist a Y- Microsphere dose was calculated utilizing body surface area formulation. Calculated dose equal 26.9 mCi. Pre therapy MAA liver SPECT scan and CTA were evaluated. Utilizing a microcatheter system, the hepatic artery was selected and Y-90 microspheres were delivered in fractionated aliquots. Radiopharmaceutical was delivered by the interventional radiologist and nuclear radiologist.  The patient tolerated procedure well. No adverse effects were noted. Bremsstrahlung scan to follow.  RADIOPHARMACEUTICALS:  26.9 mCi Yttrium 90 microspheres  COMPARISON:  MAA scan 07/17/2014, CT 04/29/2014  FINDINGS: Y - 90 microspheres therapy as above. First therapy the right hepatic lobe.  IMPRESSION: Successful Y - 90  microsphere delivery for treatment of unresectable liver metastasis. First therapy to the right hepatic lobe.   Electronically Signed   By: Suzy Bouchard M.D.   On:  07/28/2014 14:48   Ir US Guide Vasc Access Right  07/28/2014   CLINICAL DATA:  Metastatic pancreatic carcinoma to the liver. Status post mapping arteriography and transcatheter embolization of the gastroduodenal artery on 07/17/2014 in preparation for Yttrium 90 radioembolization. The patient now presents for radioembolization of the right lobe of the liver.  EXAM: 1. ULTRASOUND GUIDANCE FOR VASCULAR ACCESS OF THE RIGHT COMMON FEMORAL ARTERY 2. HEPATIC ARTERIOGRAPHY WITH SELECTIVE CATHETERIZATION OF THE CELIAC AXIS AND ARTERIOGRAPHY AT THE LEVEL OF THE COMMON HEPATIC ARTERY 3. TRANSCATHETER EMBOLIZATION OF CYSTIC ARTERY 4. FOLLOW-UP ANGIOGRAPHY AFTER CYSTIC ARTERY EMBOLIZATION 5. SELECTIVE ARTERIOGRAPHY OF THE RIGHT HEPATIC ARTERY 6. TRANSCATHETER YTTRIUM-90 RADIOEMBOLIZATION OF THE RIGHT HEPATIC ARTERY 7. FOLLOW-UP ANGIOGRAPHY AFTER RIGHT HEPATIC RADIOEMBOLIZATION  FLUOROSCOPY TIME:  10 min and 36 seconds.  MEDICATIONS AND MEDICAL HISTORY: Medications: 1 g IV vancomycin, 400 mg IV Cipro, 10 mg IV Decadron, 40 mg IV Protonix, 4 mg IV Zofran  Y-90 dose: 29 mCi  ANESTHESIA/SEDATION: 5.0 mg IV Versed; 150 mcg IV Fentanyl.  Moderate sedation time: 88 minutes  CONTRAST:  80 ml Omnipaque-300  PROCEDURE: The procedure, risks, benefits, and alternatives were explained to the patient. Questions regarding the procedure were encouraged and answered. The patient understands and consents to the procedure.  The right groin was prepped with Betadine in a sterile fashion, and a sterile drape was applied covering the operative field. A sterile gown and sterile gloves were used for the procedure. Local anesthesia was provided with 1% Lidocaine. A time-out procedure was performed.  Ultrasound was used to confirm patency of the right common femoral artery. Access of the right common femoral artery was performed under ultrasound guidance with a micropuncture set. Ultrasound image documentation was performed. A 5-French sheath was  placed. A 5-French Cobra catheter was advanced and used to selectively catheterize the celiac axis. The catheter was further advanced into the common hepatic artery and selective arteriography performed.  A microcatheter was advanced through the 5 French catheter and used to selectively catheterize the cystic artery. Selective arteriography was performed. Embolization of the cystic artery was performed with placement of 2 separate 2 mm embolization coils. Additional arteriography was then performed.  A high flow microcatheter was then advanced into the right hepatic artery and selective arteriography performed. This catheter was positioned in the right hepatic artery for treatment.  Radioembolization of the right lobe of the liver was performed with Yttrium-90 SIR Spheres. Particles were administered via a microcatheter utilizing a completely enclosed system. Monitoring of antegrade flow was performed during administration under fluoroscopy with use of contrast intermittently. After administration of the first dose of particles, the microcatheter was removed and discarded along with the attached tubing and particle vial.  Femoral puncture site was assessed with oblique arteriography. Arteriotomy closure was performed with the Cordis ExoSeal device.  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 In planning for performing right hepatic radioembolization, review of the previous nuclear medicine imaging performed after an a injection in the right hepatic artery shows prominent activity at the level of the gallbladder. Arteriography shows a fairly prominent cystic artery emanating from the proximal right hepatic artery. The hepatic artery immediately bifurcates just beyond the cystic artery origin and it was felt that there would be an increased risk of radiation induced cholecystitis  via right hepatic arterial treatment without partial embolization  of the cystic artery.  Cystic arteriography shows a normal configuration of the cystic artery which bifurcates into 2 main branches supplying the gallbladder. After placing 2 mm coils in the main cystic artery, there was significant diminished flow noted in the cystic artery. Right hepatic radioembolization was performed with a micro catheter positioned just beyond the cystic artery origin and just proximal to the bifurcation of the right hepatic artery.  Administration of Yttrium 90 SIR spheres was uncomplicated with antegrade flow maintained throughout particle administration.  Arteriotomy closure was initially successful in establishing hemostasis. The patient will recover for 6 hours after the procedure.  COMPLICATIONS: None  IMPRESSION: Hepatic arterial radioembolization performed with Yttrium-90 microspheres. A dose of 29 mCi was successfully delivered into the right hepatic artery. Prior to radioembolization, embolization of the cystic artery was performed to reduce the risk of radiation induced cholecystitis. The patient is scheduled for additional treatment of the left lobe of the liver on 08/24/2014.   Electronically Signed   By: Aletta Edouard M.D.   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 Status post mapping arteriography and transcatheter embolization of the gastroduodenal artery on 07/17/2014 in preparation for Yttrium 90 radioembolization. The patient now presents for radioembolization of the right lobe of the liver.  EXAM: 1. ULTRASOUND GUIDANCE FOR VASCULAR ACCESS OF THE RIGHT COMMON FEMORAL ARTERY 2. HEPATIC ARTERIOGRAPHY WITH SELECTIVE CATHETERIZATION OF THE CELIAC AXIS AND ARTERIOGRAPHY AT THE LEVEL OF THE COMMON HEPATIC ARTERY 3. TRANSCATHETER EMBOLIZATION OF CYSTIC ARTERY 4. FOLLOW-UP ANGIOGRAPHY AFTER CYSTIC ARTERY EMBOLIZATION 5. SELECTIVE ARTERIOGRAPHY OF THE RIGHT HEPATIC  ARTERY 6. TRANSCATHETER YTTRIUM-90 RADIOEMBOLIZATION OF THE RIGHT HEPATIC ARTERY 7. FOLLOW-UP ANGIOGRAPHY AFTER RIGHT HEPATIC RADIOEMBOLIZATION  FLUOROSCOPY TIME:  10 min and 36 seconds.  MEDICATIONS AND MEDICAL HISTORY: Medications: 1 g IV vancomycin, 400 mg IV Cipro, 10 mg IV Decadron, 40 mg IV Protonix, 4 mg IV Zofran  Y-90 dose: 29 mCi  ANESTHESIA/SEDATION: 5.0 mg IV Versed; 150 mcg IV Fentanyl.  Moderate sedation time: 88 minutes  CONTRAST:  80 ml Omnipaque-300  PROCEDURE: The procedure, risks, benefits, and alternatives were explained to the patient. Questions regarding the procedure were encouraged and answered. The patient understands and consents to the procedure.  The right groin was prepped with Betadine in a sterile fashion, and a sterile drape was applied covering the operative field. A sterile gown and sterile gloves were used for the procedure. Local anesthesia was provided with 1% Lidocaine. A time-out procedure was performed.  Ultrasound was used to confirm patency of the right common femoral artery. Access of the right common femoral artery was performed under ultrasound guidance with a micropuncture set. Ultrasound image documentation was performed. A 5-French sheath was placed. A 5-French Cobra catheter was advanced and used to selectively catheterize the celiac axis. The catheter was further advanced into the common hepatic artery and selective arteriography performed.  A microcatheter was advanced through the 5 French catheter and used to selectively catheterize the cystic artery. Selective arteriography was performed. Embolization of the cystic artery was performed with placement of 2 separate 2 mm embolization coils. Additional arteriography was then performed.  A high flow microcatheter was then advanced into the right hepatic artery and selective arteriography performed. This catheter was positioned in the right hepatic artery for treatment.  Radioembolization of the right lobe of the  liver  was performed with Yttrium-90 SIR Spheres. Particles were administered via a microcatheter utilizing a completely enclosed system. Monitoring of antegrade flow was performed during administration under fluoroscopy with use of contrast intermittently. After administration of the first dose of particles, the microcatheter was removed and discarded along with the attached tubing and particle vial.  Femoral puncture site was assessed with oblique arteriography. Arteriotomy closure was performed with the Cordis ExoSeal device.  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 In planning for performing right hepatic radioembolization, review of the previous nuclear medicine imaging performed after an a injection in the right hepatic artery shows prominent activity at the level of the gallbladder. Arteriography shows a fairly prominent cystic artery emanating from the proximal right hepatic artery. The hepatic artery immediately bifurcates just beyond the cystic artery origin and it was felt that there would be an increased risk of radiation induced cholecystitis via right hepatic arterial treatment without partial embolization of the cystic artery.  Cystic arteriography shows a normal configuration of the cystic artery which bifurcates into 2 main branches supplying the gallbladder. After placing 2 mm coils in the main cystic artery, there was significant diminished flow noted in the cystic artery. Right hepatic radioembolization was performed with a micro catheter positioned just beyond the cystic artery origin and just proximal to the bifurcation of the right hepatic artery.  Administration of Yttrium 90 SIR spheres was uncomplicated with antegrade flow maintained throughout particle administration.  Arteriotomy closure was initially successful in establishing hemostasis. The patient will recover for 6 hours after the procedure.  COMPLICATIONS: None   IMPRESSION: Hepatic arterial radioembolization performed with Yttrium-90 microspheres. A dose of 29 mCi was successfully delivered into the right hepatic artery. Prior to radioembolization, embolization of the cystic artery was performed to reduce the risk of radiation induced cholecystitis. The patient is scheduled for additional treatment of the left lobe of the liver on 08/24/2014.   Electronically Signed   By: Aletta Edouard M.D.   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 Status post mapping arteriography and transcatheter embolization of the gastroduodenal artery on 07/17/2014 in preparation for Yttrium 90 radioembolization. The patient now presents for radioembolization of the right lobe of the liver.  EXAM: 1. ULTRASOUND GUIDANCE FOR VASCULAR ACCESS OF THE RIGHT COMMON FEMORAL ARTERY 2. HEPATIC ARTERIOGRAPHY WITH SELECTIVE CATHETERIZATION OF THE CELIAC AXIS AND ARTERIOGRAPHY AT THE LEVEL OF THE COMMON HEPATIC ARTERY 3. TRANSCATHETER EMBOLIZATION OF CYSTIC ARTERY 4. FOLLOW-UP ANGIOGRAPHY AFTER CYSTIC ARTERY EMBOLIZATION 5. SELECTIVE ARTERIOGRAPHY OF THE RIGHT HEPATIC ARTERY 6. TRANSCATHETER YTTRIUM-90 RADIOEMBOLIZATION OF THE RIGHT HEPATIC ARTERY 7. FOLLOW-UP ANGIOGRAPHY AFTER RIGHT HEPATIC RADIOEMBOLIZATION  FLUOROSCOPY TIME:  10 min and 36 seconds.  MEDICATIONS AND MEDICAL HISTORY: Medications: 1 g IV vancomycin, 400 mg IV Cipro, 10 mg IV Decadron, 40 mg IV Protonix, 4 mg IV Zofran  Y-90 dose: 29 mCi  ANESTHESIA/SEDATION: 5.0 mg IV Versed; 150 mcg IV Fentanyl.  Moderate sedation time: 88 minutes  CONTRAST:  80 ml Omnipaque-300  PROCEDURE: The procedure, risks, benefits, and alternatives were explained to the patient. Questions regarding the procedure were encouraged and answered. The patient understands and consents to the procedure.  The right groin was prepped with Betadine in a sterile  fashion, and a sterile drape was applied covering the operative field. A sterile gown and sterile gloves were  used for the procedure. Local anesthesia was provided with 1% Lidocaine. A time-out procedure was performed.  Ultrasound was used to confirm patency of the right common femoral artery. Access of the right common femoral artery was performed under ultrasound guidance with a micropuncture set. Ultrasound image documentation was performed. A 5-French sheath was placed. A 5-French Cobra catheter was advanced and used to selectively catheterize the celiac axis. The catheter was further advanced into the common hepatic artery and selective arteriography performed.  A microcatheter was advanced through the 5 French catheter and used to selectively catheterize the cystic artery. Selective arteriography was performed. Embolization of the cystic artery was performed with placement of 2 separate 2 mm embolization coils. Additional arteriography was then performed.  A high flow microcatheter was then advanced into the right hepatic artery and selective arteriography performed. This catheter was positioned in the right hepatic artery for treatment.  Radioembolization of the right lobe of the liver was performed with Yttrium-90 SIR Spheres. Particles were administered via a microcatheter utilizing a completely enclosed system. Monitoring of antegrade flow was performed during administration under fluoroscopy with use of contrast intermittently. After administration of the first dose of particles, the microcatheter was removed and discarded along with the attached tubing and particle vial.  Femoral puncture site was assessed with oblique arteriography. Arteriotomy closure was performed with the Cordis ExoSeal device.  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 In planning for performing right hepatic radioembolization, review of the previous nuclear  medicine imaging performed after an a injection in the right hepatic artery shows prominent activity at the level of the gallbladder. Arteriography shows a fairly prominent cystic artery emanating from the proximal right hepatic artery. The hepatic artery immediately bifurcates just beyond the cystic artery origin and it was felt that there would be an increased risk of radiation induced cholecystitis via right hepatic arterial treatment without partial embolization of the cystic artery.  Cystic arteriography shows a normal configuration of the cystic artery which bifurcates into 2 main branches supplying the gallbladder. After placing 2 mm coils in the main cystic artery, there was significant diminished flow noted in the cystic artery. Right hepatic radioembolization was performed with a micro catheter positioned just beyond the cystic artery origin and just proximal to the bifurcation of the right hepatic artery.  Administration of Yttrium 90 SIR spheres was uncomplicated with antegrade flow maintained throughout particle administration.  Arteriotomy closure was initially successful in establishing hemostasis. The patient will recover for 6 hours after the procedure.  COMPLICATIONS: None  IMPRESSION: Hepatic arterial radioembolization performed with Yttrium-90 microspheres. A dose of 29 mCi was successfully delivered into the right hepatic artery. Prior to radioembolization, embolization of the cystic artery was performed to reduce the risk of radiation induced cholecystitis. The patient is scheduled for additional treatment of the left lobe of the liver on 08/24/2014.   Electronically Signed   By: Aletta Edouard M.D.   On: 07/28/2014 17:51   Nm Radio Pharm Therapy Intraarterial  07/28/2014   CLINICAL DATA:  Pancreatic cancer with unresectable liver metastasis. Yttrium 90 microsphere radio embolization.  EXAM: NUCLEAR MEDICINE TREATMENT PROCEDURE; NUCLEAR MEDICINE SPECIAL MED RAD PHYSICS CONS; NUCLEAR  MEDICINE RADIO PHARM THERAPY INTRA ARTERIAL  TECHNIQUE: In conjunction with the interventional radiologist a Y- Microsphere dose was calculated utilizing body surface area formulation. Calculated dose equal 26.9 mCi. Pre therapy MAA liver SPECT scan and CTA were evaluated. Utilizing a microcatheter system, the hepatic artery  was selected and Y-90 microspheres were delivered in fractionated aliquots. Radiopharmaceutical was delivered by the interventional radiologist and nuclear radiologist.  The patient tolerated procedure well. No adverse effects were noted. Bremsstrahlung scan to follow.  RADIOPHARMACEUTICALS:  26.9 mCi Yttrium 90 microspheres  COMPARISON:  MAA scan 07/17/2014, CT 04/29/2014  FINDINGS: Y - 90 microspheres therapy as above. First therapy the right hepatic lobe.  IMPRESSION: Successful Y - 90 microsphere delivery for treatment of unresectable liver metastasis. First therapy to the right hepatic lobe.   Electronically Signed   By: Suzy Bouchard M.D.   On: 07/28/2014 14:48    Labs:  CBC:  Recent Labs  03/06/14 0330 07/17/14 0750 07/28/14 0700 08/24/14 0820  WBC 4.2 3.7* 5.6 5.8  HGB 12.4 10.7* 11.0* 11.3*  HCT 37.5 33.0* 34.0* 35.5*  PLT 194 164 284 201    COAGS:  Recent Labs  03/06/14 0330 07/17/14 0750 07/28/14 0700 08/24/14 0820  INR 1.00 0.98 0.99 0.97  APTT  --  27  --   --     BMP:  Recent Labs  03/06/14 0330 07/17/14 0750 07/28/14 0700 08/24/14 0820  NA 137 142 141 136*  K 3.9 4.1 4.2 4.1  CL 97 105 101 98  CO2 25 25 27 25   GLUCOSE 106* 116* 108* 113*  BUN 15 8 13 7   CALCIUM 9.2 9.0 9.4 9.2  CREATININE 0.56 0.57 0.66 0.57  GFRNONAA >90 >90 >90 >90  GFRAA >90 >90 >90 >90    LIVER FUNCTION TESTS:  Recent Labs  03/06/14 0330 07/17/14 0750 07/28/14 0700 08/24/14 0820  BILITOT 0.7 0.7 0.6 1.4*  AST 145* 101* 64* 91*  ALT 206* 151* 57* 79*  ALKPHOS 217* 440* 379* 425*  PROT 6.1 6.8 6.9 6.7  ALBUMIN 3.0* 3.4* 3.4* 3.1*    Assessment  and Plan: Metastatic pancreatic cancer S/p right hepatic lobe Y-90 Radioembolization 07/28/14, tolerated well. Scheduled today for image guided Y-90 Radioembolization of left hepatic lobe with moderate sedation Patient has been NPO, no blood thinners taken, afebrile, labs reviewed Risks and Benefits discussed with the patient. All of the patient's questions were answered, patient is agreeable to proceed. Consent signed and in chart.    SignedHedy Jacob 08/24/2014, 9:24 AM

## 2014-08-24 NOTE — Procedures (Signed)
Procedure:  Hepatic arteriography and Y-90 radioembolization of left lobe of liver Access:  Right CFA, 5 Fr sheath Findings:  GDA remains occluded after prior embolization.  Left hepatic artery radioembolization performed with 19 mCi dose of SIR Spheres.   No immediate complications.

## 2014-08-24 NOTE — Discharge Instructions (Signed)
° °

## 2014-08-26 ENCOUNTER — Other Ambulatory Visit (HOSPITAL_COMMUNITY): Payer: Self-pay | Admitting: Interventional Radiology

## 2014-08-26 ENCOUNTER — Other Ambulatory Visit: Payer: Self-pay | Admitting: Emergency Medicine

## 2014-08-26 DIAGNOSIS — C787 Secondary malignant neoplasm of liver and intrahepatic bile duct: Principal | ICD-10-CM

## 2014-08-26 DIAGNOSIS — C252 Malignant neoplasm of tail of pancreas: Secondary | ICD-10-CM

## 2014-08-26 DIAGNOSIS — C259 Malignant neoplasm of pancreas, unspecified: Secondary | ICD-10-CM

## 2014-08-28 ENCOUNTER — Telehealth: Payer: Self-pay | Admitting: Radiology

## 2014-08-28 NOTE — Telephone Encounter (Signed)
7:47 am:  Patient called w/ complaint of 1 episode of rectal bleeding w/ bowel movement early this am.  Patient is 3 days post Y-90 SIRT.    7:55 am:  Dr Kathlene Cote notified.    8:05 am.  Per Dr Kathlene Cote:  Patient instructed to call Dr Reynaldo Minium office for appointment.  Patient & spouse state their concern that she is having a complication of X-83 SIRT as rectal bleeding is listed on discharge instructions as a possible complication.  Patient also instructed to contact our office if she cannot be seen by Dr Jacquiline Doe.  Patient understands but states that she is frustrated because she feels that this is a conflict with her discharge instructions.    Brayten Komar Riki Rusk, RN 08/28/2014 11:38 AM

## 2014-09-08 ENCOUNTER — Encounter: Payer: Self-pay | Admitting: Radiology

## 2014-09-15 ENCOUNTER — Other Ambulatory Visit: Payer: Self-pay | Admitting: Emergency Medicine

## 2014-09-15 DIAGNOSIS — C787 Secondary malignant neoplasm of liver and intrahepatic bile duct: Principal | ICD-10-CM

## 2014-09-15 DIAGNOSIS — C259 Malignant neoplasm of pancreas, unspecified: Secondary | ICD-10-CM

## 2014-09-16 ENCOUNTER — Other Ambulatory Visit: Payer: Self-pay | Admitting: Diagnostic Radiology

## 2014-09-30 ENCOUNTER — Inpatient Hospital Stay: Admission: RE | Admit: 2014-09-30 | Payer: Self-pay | Source: Ambulatory Visit

## 2014-11-10 DEATH — deceased

## 2015-04-01 IMAGING — XA IR EMBO TUMOR ORGAN ISCHEMIA INFARCT INC GUIDE ROADMAPPING
9 series · 12 of 24 positions shown · non-contrast
Comparison: none

CLINICAL DATA: Adenocarcinoma of the pancreas metastatic to the
liver and status post previous Yttrium 90 radioembolization
treatment of the right lobe of the liver on 07/28/2014. The patient
now presents for left lobe treatment to treat additional
unresectable metastatic disease.

[Series 1: body 4 · 1 of 42 frames shown]
[frame 22/42]
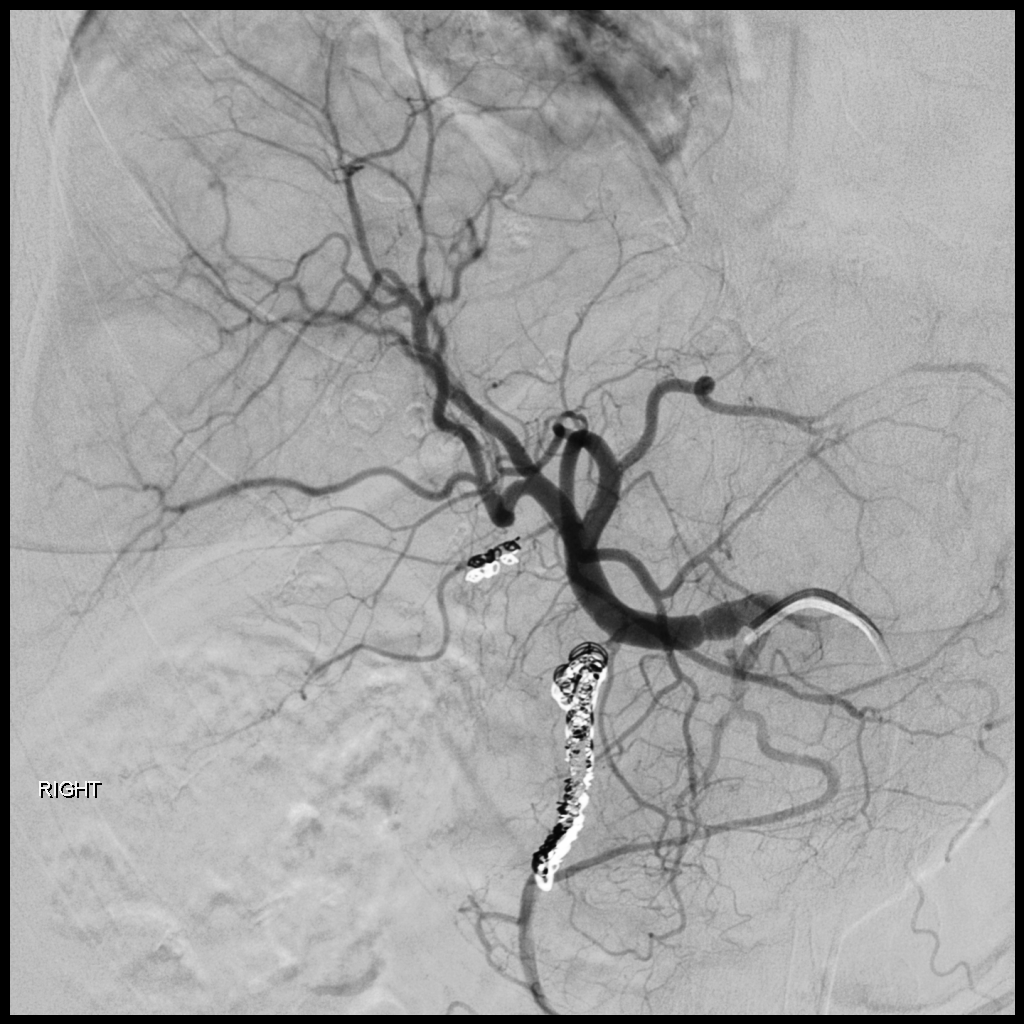

[Series 3: fl - angio · 2 of 91 frames shown (1 of 7)]
[frame 8/91]
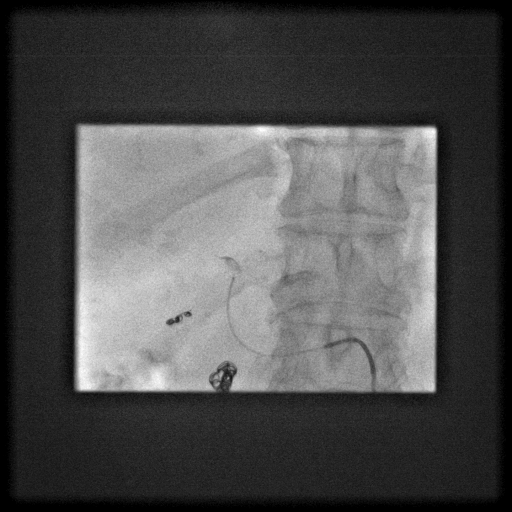
[frame 78/91]
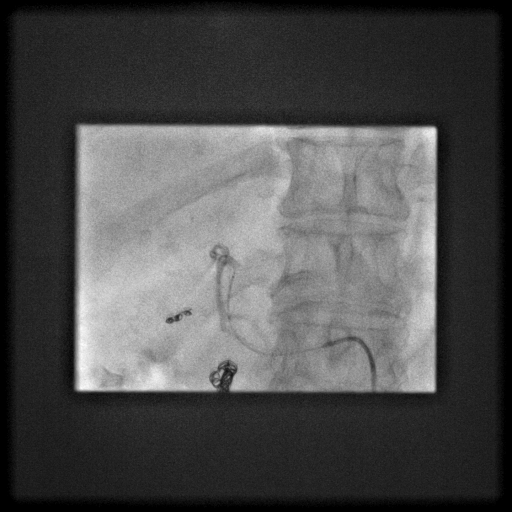

[Series 4: fl - angio · 1 of 122 frames shown (2 of 7)]
[frame 62/122]
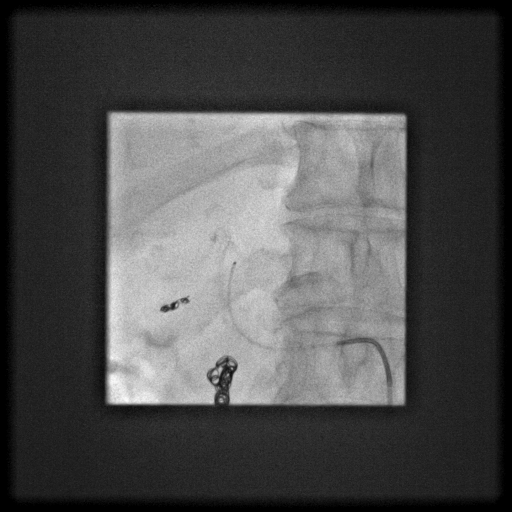

[Series 5: fl - angio · 2 of 145 frames shown (3 of 7)]
[frame 22/145]
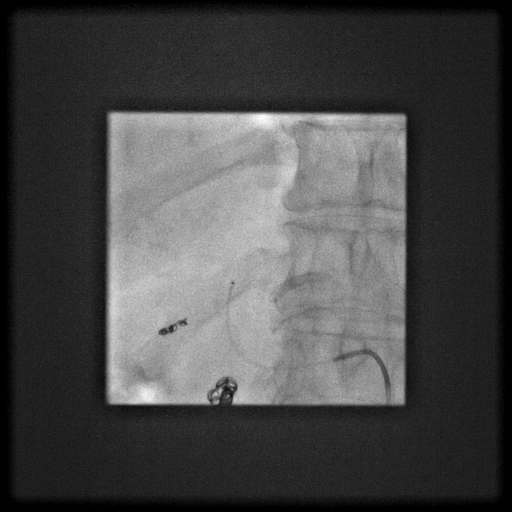
[frame 124/145]
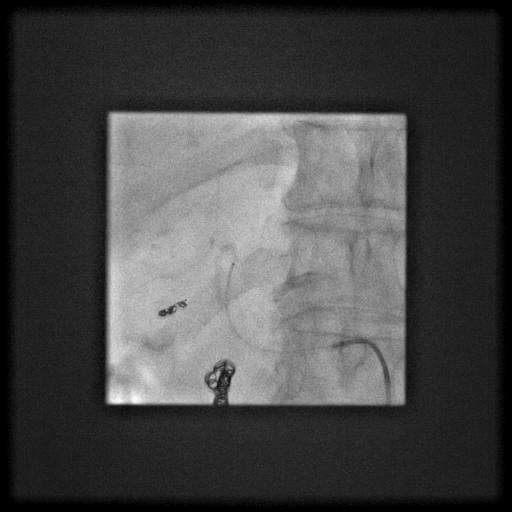

[Series 6: fl - angio · 1 of 117 frames shown (4 of 7)]
[frame 59/117]
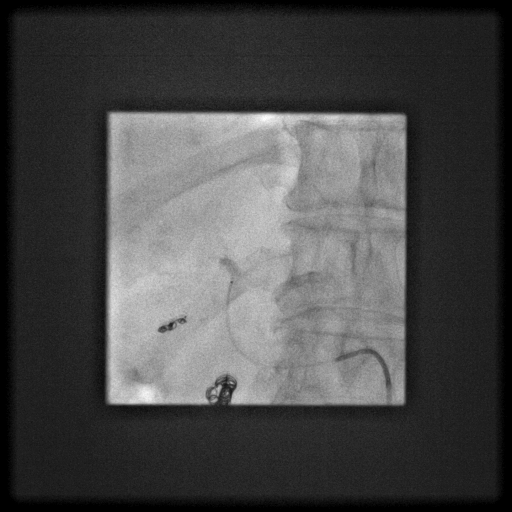

[Series 7: fl - angio · 1 of 78 frames shown (5 of 7)]
[frame 12/78]
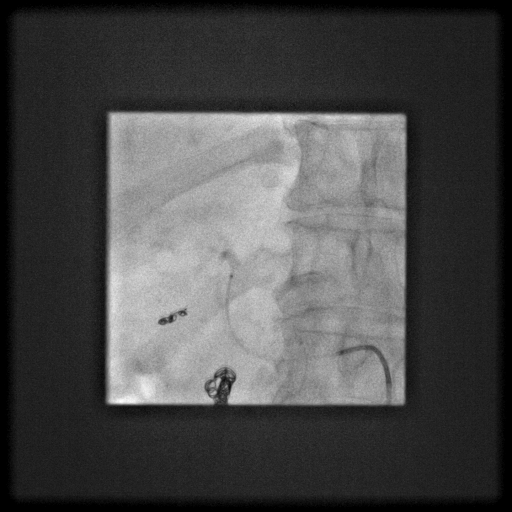

[Series 8: fl - angio · 2 of 155 frames shown (6 of 7)]
[frame 8/155]
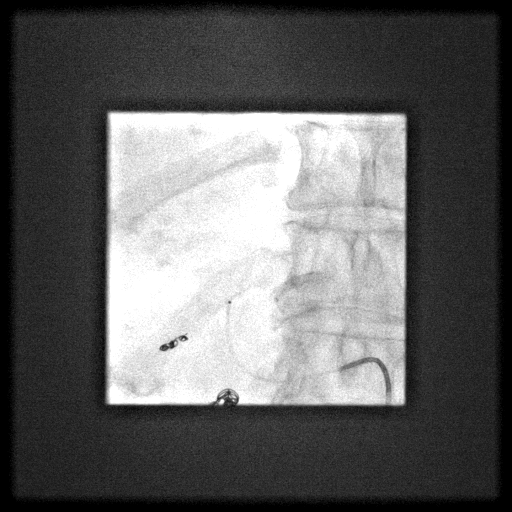
[frame 78/155]
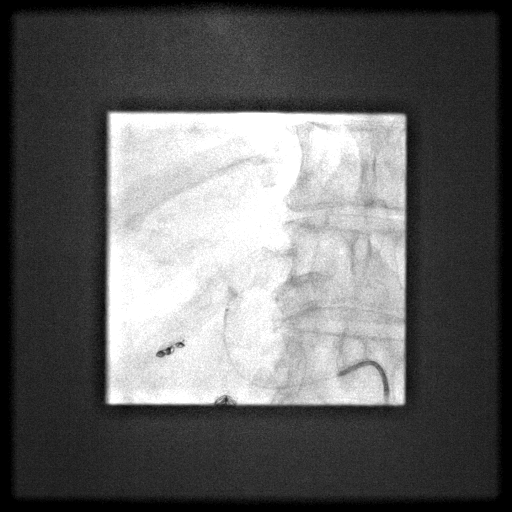

[Series 9: fl - angio · 1 of 17 frames shown (7 of 7)]
[frame 9/17]
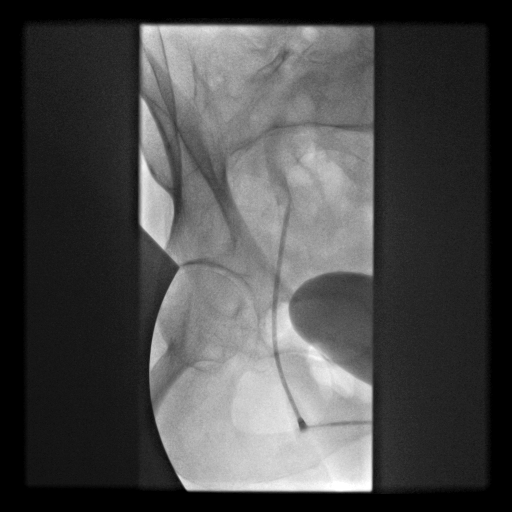

[Series 300: dsa body · 1 of 2 slices shown]
[im 2/2]
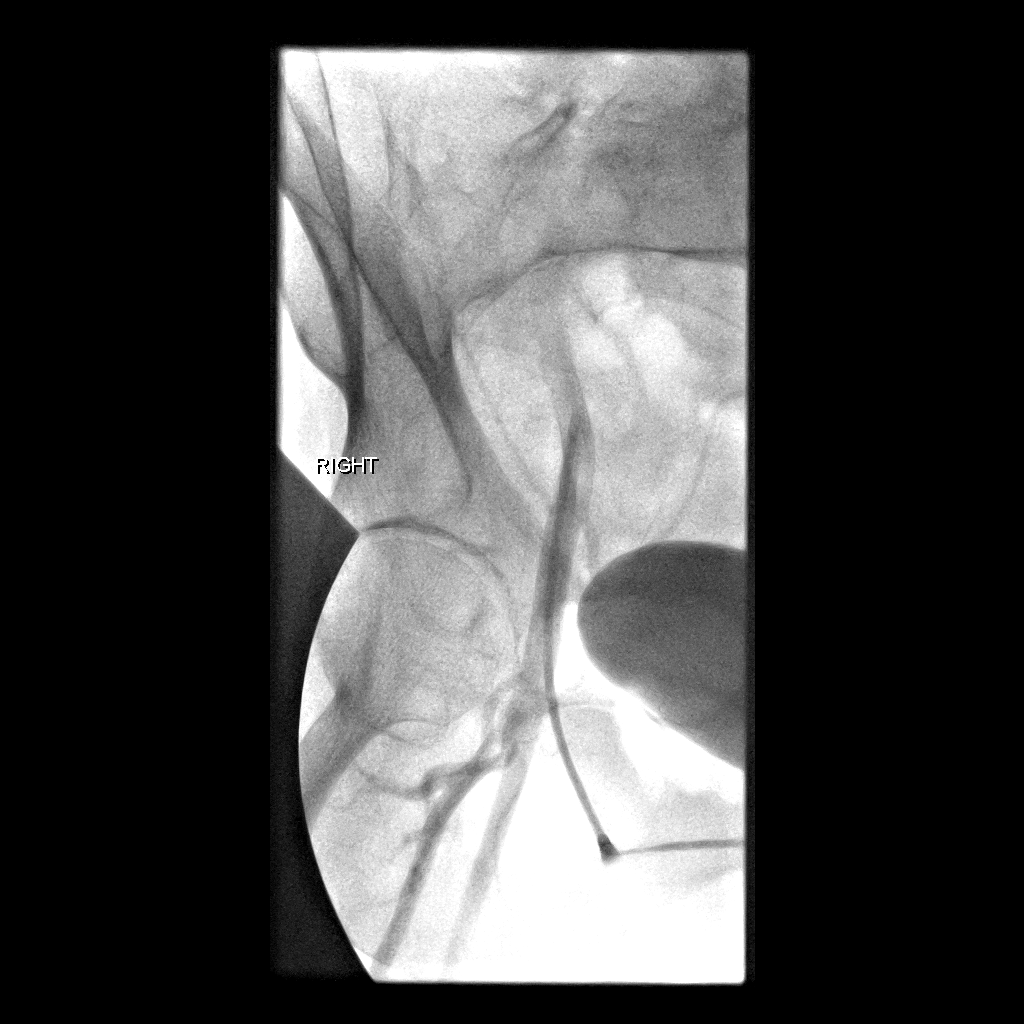

[12 of 24 positions shown; findings below may reference images not displayed]

EXAM:
1. ULTRASOUND GUIDANCE FOR VASCULAR ACCESS OF THE RIGHT COMMON
FEMORAL ARTERY
2. HEPATIC ARTERIOGRAPHY WITH SELECTIVE CATHETERIZATION OF THE
CELIAC AXIS AND ARTERIOGRAPHY AT THE LEVEL OF THE COMMON HEPATIC
ARTERY
3. SELECTIVE LEFT HEPATIC ARTERIOGRAPHY
4. TRANSCATHETER [id number] RADIOEMBOLIZATION OF THE LEFT HEPATIC
ARTERY
5. FOLLOW-UP ANGIOGRAPHY AFTER RIGHT HEPATIC RADIOEMBOLIZATION

FLUOROSCOPY TIME:  6 min and 54 seconds.

MEDICATIONS AND MEDICAL HISTORY:
6.0 mg IV Versed; 300 mcg IV Fentanyl.

Additional Medications: 400 mg IV Cipro, 10 mg IV Decadron, 40 mg IV
Protonix, 4 mg IV Zofran

Y-90 dose: 19 mCi

ANESTHESIA/SEDATION:
Moderate sedation time: 60 minutes

CONTRAST:  40 ml Dmnipaque-9UU

PROCEDURE:
The procedure, risks, benefits, and alternatives were explained to
the patient. Questions regarding the procedure were encouraged and
answered. The patient understands and consents to the procedure.

The right groin was prepped with Betadine in a sterile fashion, and
a sterile drape was applied covering the operative field. A sterile
gown and sterile gloves were used for the procedure. Local
anesthesia was provided with 1% Lidocaine. A time-out procedure was
performed.

Ultrasound was used to confirm patency of the right common femoral
artery. Access of the right common femoral artery was performed
under ultrasound guidance with a micropuncture [DATE]-French
sheath was placed. A 5-French Cobra catheter was advanced and used
to selectively catheterize the celiac axis. The catheter was further
advanced into the common hepatic artery over a guidewire and
selective arteriography performed.

A micro catheter was advanced through the 5 French catheter and used
to selectively catheterize the left hepatic artery. Selective left
hepatic arteriography was performed.

Radioembolization was performed with Bttrium-CL SIR Spheres.
Particles were administered into the left hepatic artery via a
microcatheter utilizing a completely enclosed system. Monitoring of
antegrade flow was performed during administration under fluoroscopy
with use of contrast intermittently. After administration of the
first dose of particles, the microcatheter was removed and discarded
along with the attached tubing and particle vial.

Femoral puncture site was assessed with oblique arteriography.
Arteriotomy closure was performed with the Cordis ExoSeal device.
FINDINGS: Initial arteriography shows durable occlusion of the gastroduodenal
artery after prior coil embolization. Distal gastroepiploic branches
are reconstituted by pancreaticoduodenal branches. After prior coil
embolization, the cystic artery shows recanalized flow. Distal right
and left hepatic artery branches are patent and show visible supply
to several rounded and peripherally enhancing lesions throughout the
liver.

The dose of Bttrium-CL SIR Spheres was successfully delivered via
the left hepatic artery into the left lobe of the liver.

Arteriotomy closure was initially successful in establishing
hemostasis. The patient will recover for 6 hours after the
procedure.

COMPLICATIONS:
None
IMPRESSION: Left lobe hepatic arterial radioembolization performed with
Bttrium-CL microspheres. Initial clinical follow-up will be
performed in 4 weeks.
